# Patient Record
Sex: Female | Born: 1935 | Race: White | Hispanic: No | Smoking: Former smoker
Health system: Southern US, Community
[De-identification: ages and names within clinical notes are randomized; demographics above are authoritative.]

## PROBLEM LIST (undated history)

## (undated) DIAGNOSIS — I739 Peripheral vascular disease, unspecified: Secondary | ICD-10-CM

## (undated) DIAGNOSIS — F325 Major depressive disorder, single episode, in full remission: Secondary | ICD-10-CM

## (undated) DIAGNOSIS — N189 Chronic kidney disease, unspecified: Secondary | ICD-10-CM

## (undated) DIAGNOSIS — I10 Essential (primary) hypertension: Secondary | ICD-10-CM

## (undated) DIAGNOSIS — G309 Alzheimer's disease, unspecified: Secondary | ICD-10-CM

## (undated) DIAGNOSIS — I5032 Chronic diastolic (congestive) heart failure: Secondary | ICD-10-CM

## (undated) DIAGNOSIS — I509 Heart failure, unspecified: Secondary | ICD-10-CM

## (undated) DIAGNOSIS — J449 Chronic obstructive pulmonary disease, unspecified: Secondary | ICD-10-CM

## (undated) DIAGNOSIS — E119 Type 2 diabetes mellitus without complications: Secondary | ICD-10-CM

## (undated) DIAGNOSIS — D6851 Activated protein C resistance: Secondary | ICD-10-CM

## (undated) DIAGNOSIS — J42 Unspecified chronic bronchitis: Secondary | ICD-10-CM

## (undated) DIAGNOSIS — F0280 Dementia in other diseases classified elsewhere without behavioral disturbance: Secondary | ICD-10-CM

## (undated) DIAGNOSIS — K449 Diaphragmatic hernia without obstruction or gangrene: Secondary | ICD-10-CM

## (undated) HISTORY — DX: Activated protein C resistance: D68.51

## (undated) HISTORY — PX: TONSILLECTOMY: SUR1361

## (undated) HISTORY — DX: Unspecified chronic bronchitis: J42

## (undated) HISTORY — PX: VASCULAR SURGERY: SHX849

## (undated) HISTORY — DX: Peripheral vascular disease, unspecified: I73.9

## (undated) HISTORY — DX: Chronic diastolic (congestive) heart failure: I50.32

## (undated) HISTORY — DX: Type 2 diabetes mellitus without complications: E11.9

## (undated) HISTORY — DX: Major depressive disorder, single episode, in full remission: F32.5

## (undated) HISTORY — PX: BREAST EXCISIONAL BIOPSY: SUR124

---

## 1987-08-22 DIAGNOSIS — K449 Diaphragmatic hernia without obstruction or gangrene: Secondary | ICD-10-CM

## 1987-08-22 HISTORY — DX: Diaphragmatic hernia without obstruction or gangrene: K44.9

## 2015-12-27 DIAGNOSIS — J42 Unspecified chronic bronchitis: Secondary | ICD-10-CM

## 2015-12-27 DIAGNOSIS — D6851 Activated protein C resistance: Secondary | ICD-10-CM

## 2015-12-27 DIAGNOSIS — E119 Type 2 diabetes mellitus without complications: Secondary | ICD-10-CM

## 2015-12-27 DIAGNOSIS — F325 Major depressive disorder, single episode, in full remission: Secondary | ICD-10-CM

## 2015-12-27 DIAGNOSIS — I779 Disorder of arteries and arterioles, unspecified: Secondary | ICD-10-CM

## 2015-12-27 DIAGNOSIS — I5032 Chronic diastolic (congestive) heart failure: Secondary | ICD-10-CM

## 2015-12-27 DIAGNOSIS — G309 Alzheimer's disease, unspecified: Secondary | ICD-10-CM

## 2015-12-27 DIAGNOSIS — I739 Peripheral vascular disease, unspecified: Secondary | ICD-10-CM

## 2015-12-27 DIAGNOSIS — F0280 Dementia in other diseases classified elsewhere without behavioral disturbance: Secondary | ICD-10-CM

## 2015-12-27 HISTORY — DX: Peripheral vascular disease, unspecified: I73.9

## 2015-12-27 HISTORY — DX: Unspecified chronic bronchitis: J42

## 2015-12-27 HISTORY — DX: Major depressive disorder, single episode, in full remission: F32.5

## 2015-12-27 HISTORY — DX: Type 2 diabetes mellitus without complications: E11.9

## 2015-12-27 HISTORY — DX: Disorder of arteries and arterioles, unspecified: I77.9

## 2015-12-27 HISTORY — DX: Dementia in other diseases classified elsewhere, unspecified severity, without behavioral disturbance, psychotic disturbance, mood disturbance, and anxiety: F02.80

## 2015-12-27 HISTORY — DX: Chronic diastolic (congestive) heart failure: I50.32

## 2015-12-27 HISTORY — DX: Alzheimer's disease, unspecified: G30.9

## 2015-12-27 HISTORY — DX: Activated protein C resistance: D68.51

## 2017-02-27 ENCOUNTER — Other Ambulatory Visit: Payer: Self-pay | Admitting: Physician Assistant

## 2017-02-27 DIAGNOSIS — R1310 Dysphagia, unspecified: Secondary | ICD-10-CM

## 2017-03-13 ENCOUNTER — Ambulatory Visit: Payer: Medicare Other | Attending: Physician Assistant

## 2017-08-15 ENCOUNTER — Other Ambulatory Visit: Payer: Self-pay

## 2017-08-15 ENCOUNTER — Encounter: Payer: Self-pay | Admitting: Internal Medicine

## 2017-08-15 ENCOUNTER — Emergency Department: Payer: Medicare Other

## 2017-08-15 ENCOUNTER — Inpatient Hospital Stay
Admission: EM | Admit: 2017-08-15 | Discharge: 2017-08-21 | DRG: 280 | Disposition: A | Discharge status: Skilled Nursing Facility | Payer: Medicare Other | Attending: Internal Medicine | Admitting: Internal Medicine

## 2017-08-15 ENCOUNTER — Inpatient Hospital Stay
Admit: 2017-08-15 | Discharge: 2017-08-15 | Disposition: A | Discharge status: Home / Self Care | Payer: Medicare Other | Attending: Internal Medicine | Admitting: Internal Medicine

## 2017-08-15 DIAGNOSIS — D6851 Activated protein C resistance: Secondary | ICD-10-CM | POA: Diagnosis present

## 2017-08-15 DIAGNOSIS — E875 Hyperkalemia: Secondary | ICD-10-CM | POA: Diagnosis present

## 2017-08-15 DIAGNOSIS — G9340 Encephalopathy, unspecified: Secondary | ICD-10-CM | POA: Diagnosis present

## 2017-08-15 DIAGNOSIS — E1122 Type 2 diabetes mellitus with diabetic chronic kidney disease: Secondary | ICD-10-CM | POA: Diagnosis present

## 2017-08-15 DIAGNOSIS — I6522 Occlusion and stenosis of left carotid artery: Secondary | ICD-10-CM | POA: Diagnosis present

## 2017-08-15 DIAGNOSIS — J189 Pneumonia, unspecified organism: Secondary | ICD-10-CM | POA: Diagnosis present

## 2017-08-15 DIAGNOSIS — Z79899 Other long term (current) drug therapy: Secondary | ICD-10-CM

## 2017-08-15 DIAGNOSIS — J440 Chronic obstructive pulmonary disease with acute lower respiratory infection: Secondary | ICD-10-CM | POA: Diagnosis present

## 2017-08-15 DIAGNOSIS — Z515 Encounter for palliative care: Secondary | ICD-10-CM | POA: Diagnosis not present

## 2017-08-15 DIAGNOSIS — J9621 Acute and chronic respiratory failure with hypoxia: Secondary | ICD-10-CM | POA: Diagnosis present

## 2017-08-15 DIAGNOSIS — N179 Acute kidney failure, unspecified: Secondary | ICD-10-CM | POA: Diagnosis present

## 2017-08-15 DIAGNOSIS — R945 Abnormal results of liver function studies: Secondary | ICD-10-CM | POA: Diagnosis present

## 2017-08-15 DIAGNOSIS — Z66 Do not resuscitate: Secondary | ICD-10-CM | POA: Diagnosis present

## 2017-08-15 DIAGNOSIS — Y95 Nosocomial condition: Secondary | ICD-10-CM | POA: Diagnosis present

## 2017-08-15 DIAGNOSIS — J9690 Respiratory failure, unspecified, unspecified whether with hypoxia or hypercapnia: Secondary | ICD-10-CM

## 2017-08-15 DIAGNOSIS — J9601 Acute respiratory failure with hypoxia: Secondary | ICD-10-CM | POA: Diagnosis not present

## 2017-08-15 DIAGNOSIS — E876 Hypokalemia: Secondary | ICD-10-CM | POA: Diagnosis present

## 2017-08-15 DIAGNOSIS — I248 Other forms of acute ischemic heart disease: Secondary | ICD-10-CM | POA: Diagnosis present

## 2017-08-15 DIAGNOSIS — J181 Lobar pneumonia, unspecified organism: Secondary | ICD-10-CM

## 2017-08-15 DIAGNOSIS — R7881 Bacteremia: Secondary | ICD-10-CM | POA: Diagnosis present

## 2017-08-15 DIAGNOSIS — Z825 Family history of asthma and other chronic lower respiratory diseases: Secondary | ICD-10-CM

## 2017-08-15 DIAGNOSIS — Z882 Allergy status to sulfonamides status: Secondary | ICD-10-CM

## 2017-08-15 DIAGNOSIS — E872 Acidosis: Secondary | ICD-10-CM | POA: Diagnosis present

## 2017-08-15 DIAGNOSIS — J810 Acute pulmonary edema: Secondary | ICD-10-CM | POA: Diagnosis not present

## 2017-08-15 DIAGNOSIS — G308 Other Alzheimer's disease: Secondary | ICD-10-CM | POA: Diagnosis not present

## 2017-08-15 DIAGNOSIS — N184 Chronic kidney disease, stage 4 (severe): Secondary | ICD-10-CM | POA: Diagnosis present

## 2017-08-15 DIAGNOSIS — G309 Alzheimer's disease, unspecified: Secondary | ICD-10-CM | POA: Diagnosis present

## 2017-08-15 DIAGNOSIS — F329 Major depressive disorder, single episode, unspecified: Secondary | ICD-10-CM | POA: Diagnosis present

## 2017-08-15 DIAGNOSIS — R0902 Hypoxemia: Secondary | ICD-10-CM

## 2017-08-15 DIAGNOSIS — I5033 Acute on chronic diastolic (congestive) heart failure: Secondary | ICD-10-CM | POA: Diagnosis present

## 2017-08-15 DIAGNOSIS — Z794 Long term (current) use of insulin: Secondary | ICD-10-CM

## 2017-08-15 DIAGNOSIS — Z9981 Dependence on supplemental oxygen: Secondary | ICD-10-CM | POA: Diagnosis not present

## 2017-08-15 DIAGNOSIS — R0602 Shortness of breath: Secondary | ICD-10-CM

## 2017-08-15 DIAGNOSIS — Z87891 Personal history of nicotine dependence: Secondary | ICD-10-CM

## 2017-08-15 DIAGNOSIS — N390 Urinary tract infection, site not specified: Secondary | ICD-10-CM | POA: Diagnosis present

## 2017-08-15 DIAGNOSIS — I130 Hypertensive heart and chronic kidney disease with heart failure and stage 1 through stage 4 chronic kidney disease, or unspecified chronic kidney disease: Principal | ICD-10-CM | POA: Diagnosis present

## 2017-08-15 DIAGNOSIS — R748 Abnormal levels of other serum enzymes: Secondary | ICD-10-CM | POA: Diagnosis not present

## 2017-08-15 DIAGNOSIS — I6529 Occlusion and stenosis of unspecified carotid artery: Secondary | ICD-10-CM | POA: Diagnosis present

## 2017-08-15 DIAGNOSIS — I4510 Unspecified right bundle-branch block: Secondary | ICD-10-CM | POA: Diagnosis present

## 2017-08-15 DIAGNOSIS — R0603 Acute respiratory distress: Secondary | ICD-10-CM

## 2017-08-15 DIAGNOSIS — B957 Other staphylococcus as the cause of diseases classified elsewhere: Secondary | ICD-10-CM | POA: Diagnosis present

## 2017-08-15 DIAGNOSIS — F0280 Dementia in other diseases classified elsewhere without behavioral disturbance: Secondary | ICD-10-CM

## 2017-08-15 DIAGNOSIS — E1151 Type 2 diabetes mellitus with diabetic peripheral angiopathy without gangrene: Secondary | ICD-10-CM | POA: Diagnosis present

## 2017-08-15 DIAGNOSIS — I214 Non-ST elevation (NSTEMI) myocardial infarction: Secondary | ICD-10-CM | POA: Diagnosis present

## 2017-08-15 DIAGNOSIS — R778 Other specified abnormalities of plasma proteins: Secondary | ICD-10-CM

## 2017-08-15 DIAGNOSIS — Z7902 Long term (current) use of antithrombotics/antiplatelets: Secondary | ICD-10-CM

## 2017-08-15 DIAGNOSIS — E1165 Type 2 diabetes mellitus with hyperglycemia: Secondary | ICD-10-CM | POA: Diagnosis present

## 2017-08-15 DIAGNOSIS — R7989 Other specified abnormal findings of blood chemistry: Secondary | ICD-10-CM

## 2017-08-15 HISTORY — DX: Essential (primary) hypertension: I10

## 2017-08-15 HISTORY — DX: Chronic obstructive pulmonary disease, unspecified: J44.9

## 2017-08-15 HISTORY — DX: Heart failure, unspecified: I50.9

## 2017-08-15 HISTORY — DX: Dementia in other diseases classified elsewhere without behavioral disturbance: F02.80

## 2017-08-15 HISTORY — DX: Type 2 diabetes mellitus without complications: E11.9

## 2017-08-15 HISTORY — DX: Alzheimer's disease, unspecified: G30.9

## 2017-08-15 HISTORY — DX: Peripheral vascular disease, unspecified: I73.9

## 2017-08-15 HISTORY — DX: Chronic kidney disease, unspecified: N18.9

## 2017-08-15 LAB — COMPREHENSIVE METABOLIC PANEL
ALBUMIN: 3.2 g/dL — AB (ref 3.5–5.0)
ALT: 102 U/L — ABNORMAL HIGH (ref 14–54)
ANION GAP: 9 (ref 5–15)
AST: 130 U/L — AB (ref 15–41)
Alkaline Phosphatase: 74 U/L (ref 38–126)
BILIRUBIN TOTAL: 0.8 mg/dL (ref 0.3–1.2)
BUN: 53 mg/dL — AB (ref 6–20)
CO2: 21 mmol/L — AB (ref 22–32)
Calcium: 8.9 mg/dL (ref 8.9–10.3)
Chloride: 107 mmol/L (ref 101–111)
Creatinine, Ser: 2.48 mg/dL — ABNORMAL HIGH (ref 0.44–1.00)
GFR calc Af Amer: 19 mL/min — ABNORMAL LOW (ref >60)
GFR calc non Af Amer: 17 mL/min — ABNORMAL LOW (ref >60)
GLUCOSE: 234 mg/dL — AB (ref 65–99)
POTASSIUM: 5.6 mmol/L — AB (ref 3.5–5.1)
SODIUM: 137 mmol/L (ref 135–145)
Total Protein: 8.3 g/dL — ABNORMAL HIGH (ref 6.5–8.1)

## 2017-08-15 LAB — BRAIN NATRIURETIC PEPTIDE: B Natriuretic Peptide: 2150 pg/mL — ABNORMAL HIGH (ref 0.0–100.0)

## 2017-08-15 LAB — URINALYSIS, ROUTINE W REFLEX MICROSCOPIC
Bilirubin Urine: NEGATIVE
GLUCOSE, UA: 150 mg/dL — AB
KETONES UR: NEGATIVE mg/dL
NITRITE: NEGATIVE
PH: 5 (ref 5.0–8.0)
Protein, ur: 100 mg/dL — AB
Specific Gravity, Urine: 1.012 (ref 1.005–1.030)

## 2017-08-15 LAB — PROCALCITONIN: PROCALCITONIN: 0.56 ng/mL

## 2017-08-15 LAB — CBC WITH DIFFERENTIAL/PLATELET
BASOS ABS: 0 10*3/uL (ref 0–0.1)
Basophils Relative: 0 %
EOS PCT: 0 %
Eosinophils Absolute: 0 10*3/uL (ref 0–0.7)
HEMATOCRIT: 39.5 % (ref 35.0–47.0)
Hemoglobin: 12.5 g/dL (ref 12.0–16.0)
LYMPHS ABS: 0.3 10*3/uL — AB (ref 1.0–3.6)
LYMPHS PCT: 2 %
MCH: 30.1 pg (ref 26.0–34.0)
MCHC: 31.6 g/dL — AB (ref 32.0–36.0)
MCV: 95.1 fL (ref 80.0–100.0)
MONO ABS: 0.6 10*3/uL (ref 0.2–0.9)
MONOS PCT: 5 %
NEUTROS ABS: 11.4 10*3/uL — AB (ref 1.4–6.5)
Neutrophils Relative %: 93 %
PLATELETS: 237 10*3/uL (ref 150–440)
RBC: 4.15 MIL/uL (ref 3.80–5.20)
RDW: 13.9 % (ref 11.5–14.5)
WBC: 12.4 10*3/uL — ABNORMAL HIGH (ref 3.6–11.0)

## 2017-08-15 LAB — BLOOD GAS, ARTERIAL
ACID-BASE DEFICIT: 6.4 mmol/L — AB (ref 0.0–2.0)
BICARBONATE: 20.6 mmol/L (ref 20.0–28.0)
Delivery systems: POSITIVE
Expiratory PAP: 5
FIO2: 0.4
Inspiratory PAP: 10
MECHANICAL RATE: 12
O2 Saturation: 98 %
PATIENT TEMPERATURE: 37
PCO2 ART: 46 mmHg (ref 32.0–48.0)
PH ART: 7.26 — AB (ref 7.350–7.450)
pO2, Arterial: 118 mmHg — ABNORMAL HIGH (ref 83.0–108.0)

## 2017-08-15 LAB — GLUCOSE, CAPILLARY
GLUCOSE-CAPILLARY: 197 mg/dL — AB (ref 65–99)
GLUCOSE-CAPILLARY: 190 mg/dL — AB (ref 65–99)
GLUCOSE-CAPILLARY: 138 mg/dL — AB (ref 65–99)
Glucose-Capillary: 138 mg/dL — ABNORMAL HIGH (ref 65–99)
Glucose-Capillary: 185 mg/dL — ABNORMAL HIGH (ref 65–99)

## 2017-08-15 LAB — CREATININE, SERUM
CREATININE: 2.52 mg/dL — AB (ref 0.44–1.00)
GFR calc Af Amer: 19 mL/min — ABNORMAL LOW (ref >60)
GFR, EST NON AFRICAN AMERICAN: 16 mL/min — AB (ref >60)

## 2017-08-15 LAB — ECHOCARDIOGRAM COMPLETE
Height: 67 in
Weight: 2493.84 oz

## 2017-08-15 LAB — LACTIC ACID, PLASMA: Lactic Acid, Venous: 2 mmol/L (ref 0.5–1.9)

## 2017-08-15 LAB — MRSA PCR SCREENING: MRSA BY PCR: NEGATIVE

## 2017-08-15 LAB — TROPONIN I
TROPONIN I: 1.39 ng/mL — AB (ref <0.03)
Troponin I: 6.25 ng/mL (ref <0.03)
Troponin I: 11.17 ng/mL (ref <0.03)

## 2017-08-15 LAB — POTASSIUM: POTASSIUM: 3.9 mmol/L (ref 3.5–5.1)

## 2017-08-15 MED ORDER — CLOPIDOGREL BISULFATE 75 MG PO TABS
75.0000 mg | ORAL_TABLET | Freq: Every day | ORAL | Status: DC
  Administered 2017-08-15 – 2017-08-21 (×7): 75 mg via ORAL
  Filled 2017-08-15 (×8): qty 1

## 2017-08-15 MED ORDER — AMLODIPINE BESYLATE 5 MG PO TABS: 2.5000 mg | ORAL_TABLET | Freq: Every day | ORAL | Status: DC

## 2017-08-15 MED ORDER — INSULIN ASPART 100 UNIT/ML ~~LOC~~ SOLN: 0.0000 [IU] | Freq: Every day | SUBCUTANEOUS | Status: DC

## 2017-08-15 MED ORDER — ONDANSETRON HCL 4 MG/2ML IJ SOLN: 4.0000 mg | Freq: Four times a day (QID) | INTRAMUSCULAR | Status: DC | PRN

## 2017-08-15 MED ORDER — PIPERACILLIN-TAZOBACTAM 3.375 G IVPB 30 MIN
3.3750 g | Freq: Once | INTRAVENOUS | Status: AC
  Administered 2017-08-15: 3.375 g via INTRAVENOUS
  Filled 2017-08-15: qty 50

## 2017-08-15 MED ORDER — FAMOTIDINE 20 MG PO TABS
10.0000 mg | ORAL_TABLET | Freq: Every day | ORAL | Status: DC
  Administered 2017-08-15 – 2017-08-21 (×7): 10 mg via ORAL
  Filled 2017-08-15 (×7): qty 1

## 2017-08-15 MED ORDER — SODIUM POLYSTYRENE SULFONATE 15 GM/60ML PO SUSP
30.0000 g | Freq: Once | ORAL | Status: AC
  Administered 2017-08-15: 30 g via ORAL
  Filled 2017-08-15: qty 120

## 2017-08-15 MED ORDER — AZITHROMYCIN 500 MG IV SOLR
250.0000 mg | INTRAVENOUS | Status: DC
  Administered 2017-08-15 – 2017-08-16 (×2): 250 mg via INTRAVENOUS
  Filled 2017-08-15 (×3): qty 250

## 2017-08-15 MED ORDER — BISACODYL 5 MG PO TBEC: 5.0000 mg | DELAYED_RELEASE_TABLET | Freq: Every day | ORAL | Status: DC | PRN

## 2017-08-15 MED ORDER — SODIUM CHLORIDE 0.9% FLUSH: 3.0000 mL | INTRAVENOUS | Status: DC | PRN

## 2017-08-15 MED ORDER — ASPIRIN EC 81 MG PO TBEC
81.0000 mg | DELAYED_RELEASE_TABLET | Freq: Every day | ORAL | Status: DC
  Administered 2017-08-16 – 2017-08-21 (×6): 81 mg via ORAL
  Filled 2017-08-15 (×6): qty 1

## 2017-08-15 MED ORDER — ALPRAZOLAM 0.25 MG PO TABS
0.1250 mg | ORAL_TABLET | Freq: Every day | ORAL | Status: DC
  Administered 2017-08-17 – 2017-08-19 (×3): 0.25 mg via ORAL
  Filled 2017-08-15 (×3): qty 1

## 2017-08-15 MED ORDER — ATORVASTATIN CALCIUM 20 MG PO TABS
80.0000 mg | ORAL_TABLET | Freq: Every day | ORAL | Status: DC
  Administered 2017-08-15 – 2017-08-20 (×6): 80 mg via ORAL
  Filled 2017-08-15 (×6): qty 4

## 2017-08-15 MED ORDER — TIOTROPIUM BROMIDE MONOHYDRATE 18 MCG IN CAPS
18.0000 ug | ORAL_CAPSULE | Freq: Every day | RESPIRATORY_TRACT | Status: DC
  Filled 2017-08-15: qty 5

## 2017-08-15 MED ORDER — FUROSEMIDE 10 MG/ML IJ SOLN
40.0000 mg | Freq: Two times a day (BID) | INTRAMUSCULAR | Status: AC
  Administered 2017-08-15 – 2017-08-16 (×3): 40 mg via INTRAVENOUS
  Filled 2017-08-15 (×3): qty 4

## 2017-08-15 MED ORDER — FUROSEMIDE 10 MG/ML IJ SOLN
40.0000 mg | Freq: Once | INTRAMUSCULAR | Status: AC
  Administered 2017-08-15: 40 mg via INTRAVENOUS
  Filled 2017-08-15: qty 4

## 2017-08-15 MED ORDER — ACETAMINOPHEN 650 MG RE SUPP: 650.0000 mg | Freq: Four times a day (QID) | RECTAL | Status: DC | PRN

## 2017-08-15 MED ORDER — CARVEDILOL 3.125 MG PO TABS: 3.1250 mg | ORAL_TABLET | Freq: Two times a day (BID) | ORAL | Status: DC

## 2017-08-15 MED ORDER — SODIUM CHLORIDE 0.9 % IV BOLUS (SEPSIS)
1000.0000 mL | Freq: Once | INTRAVENOUS | Status: AC
  Administered 2017-08-15: 1000 mL via INTRAVENOUS

## 2017-08-15 MED ORDER — ONDANSETRON HCL 4 MG PO TABS: 4.0000 mg | ORAL_TABLET | Freq: Four times a day (QID) | ORAL | Status: DC | PRN

## 2017-08-15 MED ORDER — GUAIFENESIN-DM 100-10 MG/5ML PO SYRP
5.0000 mL | ORAL_SOLUTION | ORAL | Status: DC | PRN
  Administered 2017-08-20: 5 mL via ORAL
  Filled 2017-08-15: qty 5

## 2017-08-15 MED ORDER — SODIUM CHLORIDE 0.9 % IV SOLN
250.0000 mL | INTRAVENOUS | Status: DC | PRN
Start: 2017-08-15 — End: 2017-08-21

## 2017-08-15 MED ORDER — ALBUTEROL SULFATE (2.5 MG/3ML) 0.083% IN NEBU: 2.5000 mg | INHALATION_SOLUTION | RESPIRATORY_TRACT | Status: DC | PRN

## 2017-08-15 MED ORDER — INSULIN ASPART 100 UNIT/ML ~~LOC~~ SOLN
0.0000 [IU] | SUBCUTANEOUS | Status: DC
  Administered 2017-08-15 (×3): 2 [IU] via SUBCUTANEOUS
  Administered 2017-08-15: 1 [IU] via SUBCUTANEOUS
  Filled 2017-08-15 (×4): qty 1

## 2017-08-15 MED ORDER — GUAIFENESIN ER 600 MG PO TB12: 600.0000 mg | ORAL_TABLET | Freq: Two times a day (BID) | ORAL | Status: DC

## 2017-08-15 MED ORDER — ASPIRIN 81 MG PO CHEW
324.0000 mg | CHEWABLE_TABLET | Freq: Once | ORAL | Status: AC
  Administered 2017-08-15: 324 mg via ORAL
  Filled 2017-08-15: qty 4

## 2017-08-15 MED ORDER — ACETAMINOPHEN 325 MG PO TABS
650.0000 mg | ORAL_TABLET | Freq: Four times a day (QID) | ORAL | Status: DC | PRN
Start: 2017-08-15 — End: 2017-08-21

## 2017-08-15 MED ORDER — HEPARIN SODIUM (PORCINE) 5000 UNIT/ML IJ SOLN
5000.0000 [IU] | Freq: Three times a day (TID) | INTRAMUSCULAR | Status: DC
  Administered 2017-08-15 – 2017-08-21 (×16): 5000 [IU] via SUBCUTANEOUS
  Filled 2017-08-15 (×16): qty 1

## 2017-08-15 MED ORDER — COLCHICINE 0.6 MG PO TABS: 0.6000 mg | ORAL_TABLET | Freq: Two times a day (BID) | ORAL | Status: DC

## 2017-08-15 MED ORDER — ORAL CARE MOUTH RINSE
15.0000 mL | Freq: Two times a day (BID) | OROMUCOSAL | Status: DC
  Administered 2017-08-15 – 2017-08-20 (×6): 15 mL via OROMUCOSAL

## 2017-08-15 MED ORDER — DEXTROSE 5 % IV SOLN
1.0000 g | INTRAVENOUS | Status: DC
  Administered 2017-08-15 – 2017-08-17 (×3): 1 g via INTRAVENOUS
  Filled 2017-08-15 (×4): qty 10

## 2017-08-15 MED ORDER — SODIUM CHLORIDE 0.9% FLUSH
3.0000 mL | Freq: Two times a day (BID) | INTRAVENOUS | Status: DC
  Administered 2017-08-15 – 2017-08-21 (×13): 3 mL via INTRAVENOUS

## 2017-08-15 MED ORDER — IPRATROPIUM-ALBUTEROL 0.5-2.5 (3) MG/3ML IN SOLN
3.0000 mL | Freq: Four times a day (QID) | RESPIRATORY_TRACT | Status: DC
  Administered 2017-08-15 – 2017-08-17 (×10): 3 mL via RESPIRATORY_TRACT
  Filled 2017-08-15 (×10): qty 3

## 2017-08-15 MED ORDER — CHLORHEXIDINE GLUCONATE 0.12 % MT SOLN
15.0000 mL | Freq: Two times a day (BID) | OROMUCOSAL | Status: DC
  Administered 2017-08-16 – 2017-08-21 (×9): 15 mL via OROMUCOSAL
  Filled 2017-08-15 (×8): qty 15

## 2017-08-15 MED ORDER — VANCOMYCIN HCL IN DEXTROSE 1-5 GM/200ML-% IV SOLN
1000.0000 mg | Freq: Once | INTRAVENOUS | Status: AC
  Administered 2017-08-15: 1000 mg via INTRAVENOUS
  Filled 2017-08-15: qty 200

## 2017-08-15 MED ORDER — SENNOSIDES-DOCUSATE SODIUM 8.6-50 MG PO TABS: 1.0000 | ORAL_TABLET | Freq: Every evening | ORAL | Status: DC | PRN

## 2017-08-15 MED ORDER — INSULIN ASPART 100 UNIT/ML ~~LOC~~ SOLN: 0.0000 [IU] | Freq: Three times a day (TID) | SUBCUTANEOUS | Status: DC

## 2017-08-15 MED ORDER — MEMANTINE HCL 10 MG PO TABS
10.0000 mg | ORAL_TABLET | Freq: Two times a day (BID) | ORAL | Status: DC
  Administered 2017-08-16 – 2017-08-21 (×11): 10 mg via ORAL
  Filled 2017-08-15 (×14): qty 1

## 2017-08-15 NOTE — Progress Notes (Signed)
Spoke with MD Fath and notified him of elevation in troponin values from admission to present time.

## 2017-08-15 NOTE — Progress Notes (Signed)
CODE SEPSIS - PHARMACY COMMUNICATION  **Broad Spectrum Antibiotics should be administered within 1 hour of Sepsis diagnosis**  Time Code Sepsis Called/Page Received: 12/26 0645  Antibiotics Ordered: 12/26 0645  Zosyn and Vanc  Time of 1st antibiotic administration: 0745 Vanc,  0748 Zosyn  Additional action taken by pharmacy: Called ER RN @ [phone]- per RN they are working on getting IV access on pt.  Called RN again @ [phone] as Clement HusbandsVanc was charted as hung but not Zosyn yet. RN said he was in process of hanging Zosyn  If necessary, Name of Provider/Nurse Contacted: Melinda CrutchBill    Klea Nall A ,PharmD Clinical Pharmacist  08/15/2017  7:58 AM

## 2017-08-15 NOTE — Progress Notes (Signed)
Per floor RN pt has 2 IV access, waiting for nephrologist to see patient. Will update PICC team after seen by renal per primary nurse.

## 2017-08-15 NOTE — ED Provider Notes (Signed)
[facility] Emergency Department Provider Note   ____________________________________________   First MD Initiated Contact with Patient 08/15/17 [phone]     (approximate)  I have reviewed the triage vital signs and the nursing notes.   HISTORY  Chief Complaint Shortness of Breath  Level 5 caveat: Limited by distress  HPI Cassandra Mccall is a 83 y.o. female brought to the ED from home via EMS with a chief complaint of shortness of breath.  EMS reports patient is on day 3 of Z-Pak for pneumonia.  Family states patient slid down in the bed and the family had a very hard time getting her back up secondary to generalized weakness.  Patient denies chest pain but complains of chills, cough and difficulty breathing.  Arrives to the ED in moderate respiratory distress with room air saturations 85%.   Past medical history . Alzheimer disease 12/27/2015  . CHF (congestive heart failure), NYHA class I, chronic, diastolic (CMS-HCC) 12/27/2015  . Chronic bronchitis (CMS-HCC) 12/27/2015  . Controlled type 2 diabetes mellitus without complication (CMS-HCC) 12/27/2015  . Depression, major, in remission (CMS-HCC) 12/27/2015  On xanax prn  . Factor V Leiden (CMS-HCC) 12/27/2015  . Left-sided carotid artery disease (CMS-HCC) 12/27/2015  100 % left sided, less so right  . PVD (peripheral vascular disease) (CMS-HCC) 12/27/2015  Post stents in [place]   There are no active problems to display for this patient.   Past surgical history . BREAST EXCISIONAL BIOPSY  . TONSILLECTOMY    Prior to Admission medications   Not on File    Allergies . Statins-Hmg-Coa Reductase Inhibitors Muscle Pain  Unclear if took  . Sulfa (Sulfonamide Antibiotics) Unknown   Family history . Heart failure Mother  . COPD Mother  . Alzheimer's disease Father  . COPD Sister   Social History Social History   Tobacco Use  . Smoking status: Not on file  Substance Use Topics  . Alcohol use: Not on  file  . Drug use: Not on file  Non-smoker  Review of Systems  Constitutional: Positive for chills. Eyes: No visual changes. ENT: No sore throat. Cardiovascular: Denies chest pain. Respiratory: Positive for cough and shortness of breath. Gastrointestinal: No abdominal pain.  No nausea, no vomiting.  No diarrhea.  No constipation. Genitourinary: Negative for dysuria. Musculoskeletal: Negative for back pain. Skin: Negative for rash. Neurological: Negative for headaches, focal weakness or numbness.   ____________________________________________   PHYSICAL EXAM:  VITAL SIGNS: ED Triage Vitals  Enc Vitals Group     BP      Pulse      Resp      Temp      Temp src      SpO2      Weight      Height      Head Circumference      Peak Flow      Pain Score      Pain Loc      Pain Edu?      Excl. in GC?     Constitutional: Alert and oriented. Ill appearing and in moderate acute distress. Eyes: Conjunctivae are normal. PERRL. EOMI. Head: Atraumatic. Nose: No congestion/rhinnorhea. Mouth/Throat: Mucous membranes are moist.  Oropharynx non-erythematous. Neck: No stridor.   Cardiovascular: Normal rate, regular rhythm. Grossly normal heart sounds.  Good peripheral circulation. Respiratory: Increased respiratory effort.  No retractions. Lungs with rhonchi and rales bilaterally. Gastrointestinal: Soft and nontender. No distention. No abdominal bruits. No CVA tenderness. Musculoskeletal: No lower  extremity tenderness nor edema.  No joint effusions. Neurologic:  Normal speech and language. No gross focal neurologic deficits are appreciated.  Skin:  Skin is warm, dry and intact. No rash noted. Psychiatric: Mood and affect are normal. Speech and behavior are normal.  ____________________________________________   LABS (all labs ordered are listed, but only abnormal results are displayed)  Labs Reviewed  CULTURE, BLOOD (ROUTINE X 2)  CULTURE, BLOOD (ROUTINE X 2)  URINE CULTURE    COMPREHENSIVE METABOLIC PANEL  CBC WITH DIFFERENTIAL/PLATELET  URINALYSIS, ROUTINE W REFLEX MICROSCOPIC  LACTIC ACID, PLASMA  LACTIC ACID, PLASMA  BRAIN NATRIURETIC PEPTIDE  TROPONIN I  BLOOD GAS, ARTERIAL   ____________________________________________  EKG  ED ECG REPORT I, SUNG,JADE J, the attending physician, personally viewed and interpreted this ECG.   Date: 08/15/2017  EKG Time: 0648  Rate: 86  Rhythm: normal EKG, normal sinus rhythm  Axis: Normal  Intervals:right bundle branch block  ST&T Change: Nonspecific  ____________________________________________  RADIOLOGY  No results found.  ____________________________________________   PROCEDURES  Procedure(s) performed: None  Procedures  Critical Care performed: Yes, see critical care note(s)   CRITICAL CARE Performed by: Irean Hong   Total critical care time: 30 minutes  Critical care time was exclusive of separately billable procedures and treating other patients.  Critical care was necessary to treat or prevent imminent or life-threatening deterioration.  Critical care was time spent personally by me on the following activities: development of treatment plan with patient and/or surrogate as well as nursing, discussions with consultants, evaluation of patient's response to treatment, examination of patient, obtaining history from patient or surrogate, ordering and performing treatments and interventions, ordering and review of laboratory studies, ordering and review of radiographic studies, pulse oximetry and re-evaluation of patient's condition.  ____________________________________________   INITIAL IMPRESSION / ASSESSMENT AND PLAN / ED COURSE  As part of my medical decision making, I reviewed the following data within the electronic MEDICAL RECORD NUMBER History obtained from family, Nursing notes reviewed and incorporated, Labs reviewed, EKG interpreted, Old chart reviewed, Patient signed out to Dr.  Mayford Knife, Radiograph reviewed, Discussed with admitting physician and Notes from prior ED visits.   83 year old female who presents with respiratory distress. Differential includes, but is not limited to, viral syndrome, bronchitis including COPD exacerbation, pneumonia, reactive airway disease including asthma, CHF including exacerbation with or without pulmonary/interstitial edema, pneumothorax, ACS, thoracic trauma, and pulmonary embolism.  Patient with a history of CAD, chronic bronchitis, CHF, factor V Leiden currently on azithromycin for community-acquired pneumonia.  She arrives in moderate respiratory distress, room air saturations 85%, tachypneic with rales and rhonchi on auscultation.  ED code sepsis called upon patient's arrival to the treatment room.  Will place patient on BiPAP, check screening lab work, chest x-ray.  Anticipate hospitalization.  Care transferred to oncoming provider Dr. Mayford Knife.  Clinical Course as of Aug 16 703  Wed Aug 15, 2017  0703 Patient looking like she is improving on BiPAP.  Family at bedside.  Will initiate first liter IV fluids.  Hold additional fluids pending lactate as patient has history of CHF.  [JS]    Clinical Course User Index [JS] Irean Hong, MD     ____________________________________________   FINAL CLINICAL IMPRESSION(S) / ED DIAGNOSES  Final diagnoses:  Hypoxia  SOB (shortness of breath)  HCAP (healthcare-associated pneumonia)  Respiratory distress     ED Discharge Orders    None       Note:  This document was prepared using Dragon  voice recognition software and may include unintentional dictation errors.    Irean HongSung, Jade J, MD 08/15/17 [phone]

## 2017-08-15 NOTE — ED Triage Notes (Signed)
[facility] reports pt is on day 3 of ABX t/x for pneumonia. EMS states pt "slid down in the bed and family had a very hard time getting her up"; EMS reports that pt's O2 sats drop when trying to cough, but that her O2 sats recover after the pt is able to clear her throat. Pt arrives on supplemental O2 via [place], but only wears O2 at home at night. Dr Dolores FrameSung at bedside upon pt's arrival to ED.

## 2017-08-15 NOTE — Progress Notes (Addendum)
CODE SEPSIS - PHARMACY COMMUNICATION  **Broad Spectrum Antibiotics should be administered within 1 hour of Sepsis diagnosis**  Time Code Sepsis Called/Page Received: 12/26 0645  Antibiotics Ordered: 12/26 0645  Zosyn and Vanc  Time of 1st antibiotic administration:   Additional action taken by pharmacy: Called ER RN @ [phone]- per RN they are working on getting IV access on pt.  If necessary, Name of Provider/Nurse Contacted:     Erich MontaneMcBane,Matthew S ,PharmD Clinical Pharmacist  08/15/2017  6:49 AM

## 2017-08-15 NOTE — Consult Note (Signed)
PULMONARY / CRITICAL CARE MEDICINE   Name: Cassandra Mccall MRN: [id number] DOB: [birth year]    ADMISSION DATE:  08/15/2017 CONSULTATION DATE:  12/26  REFERRING MD:  Imogene Burn  PT PROFILE: 37 F with advanced dementia admitted via ED with acute hypoxemic respiratory failure and distress, suspected pneumonia, possible CHF, AKI on BiPAP.  DNR  MAJOR EVENTS/TEST RESULTS:   INDWELLING DEVICES:   MICRO DATA: MRSA PCR 12/26 >>  Urine 12/26>>  Blood 12/26 >>   ANTIMICROBIALS:  Azithromycin 12/26 >>  Ceftriaxone 12/26 >>   HISTORY OF PRESENT ILLNESS:   Cassandra Mccall has advanced dementia but lives at home with her daughter and son.  3 days prior to admission she developed cough and was seen in the walk-in clinic at [facility]  She was told that she might have pneumonia based on a chest x-ray.  She was started on azithromycin.  On the night of admission, she developed drenching sweats and increased cough and respiratory distress.  She was brought to the emergency department by EMS where she was found to be hypoxic.  Noninvasive ventilation was initiated.  PAST MEDICAL HISTORY :  She  has a past medical history of Alzheimer disease, CHF (congestive heart failure) (HCC), Chronic kidney disease, COPD (chronic obstructive pulmonary disease) (HCC), Diabetes mellitus without complication (HCC), Hypertension, PAD (peripheral artery disease) (HCC), and PVD (peripheral vascular disease) (HCC).  PAST SURGICAL HISTORY: She  has a past surgical history that includes Vascular surgery.  Allergies  Allergen Reactions  . Sulfa Antibiotics     No current facility-administered medications on file prior to encounter.    Current Outpatient Medications on File Prior to Encounter  Medication Sig  . albuterol (PROVENTIL HFA) 108 (90 Base) MCG/ACT inhaler Inhale 2 puffs into the lungs every 6 (six) hours as needed.  . ALPRAZolam (XANAX) 0.25 MG tablet Take 0.5-1 tablets by mouth at bedtime.   Marland Kitchen amLODipine  (NORVASC) 2.5 MG tablet Take 1 tablet by mouth daily.  Alphonsus Sias Pollen 580 MG CAPS Take 2 capsules by mouth 2 (two) times daily.   . clopidogrel (PLAVIX) 75 MG tablet Take 1 tablet by mouth daily.  . fluticasone-salmeterol (ADVAIR HFA) 230-21 MCG/ACT inhaler Inhale 2 puffs into the lungs every 12 (twelve) hours as needed.  . furosemide (LASIX) 20 MG tablet Take 1 tablet by mouth daily.  Marland Kitchen guaiFENesin 200 MG tablet Take 600 mg by mouth 2 (two) times daily.   . memantine (NAMENDA) 10 MG tablet Take 1 tablet by mouth 2 (two) times daily.  . metoprolol succinate (TOPROL-XL) 25 MG 24 hr tablet Take 1 tablet by mouth daily.  . Omega-3 Fatty Acids (FISH OIL PO) Take 1 capsule by mouth 2 (two) times daily.  . potassium chloride (K-DUR) 10 MEQ tablet Take 1 tablet by mouth daily as needed.  . ranitidine (ZANTAC) 75 MG tablet Take 150 mg by mouth daily.   Marland Kitchen tiotropium (SPIRIVA) 18 MCG inhalation capsule Place 18 mcg into inhaler and inhale daily.  . vitamin B-12 (CYANOCOBALAMIN) 100 MCG tablet Take 100 mcg by mouth daily.  . mupirocin ointment (BACTROBAN) 2 % Apply 1 application topically 2 (two) times daily.    FAMILY HISTORY:  Her indicated that the status of her mother is unknown. She indicated that the status of her father is unknown.   SOCIAL HISTORY: Former smoker No significant occupational or environmental exposures    REVIEW OF SYSTEMS:   Level 5 caveat  SUBJECTIVE:    VITAL SIGNS: BP Marland Kitchen)  142/61   Pulse 73   Temp 98.5 F (36.9 C) (Axillary)   Resp (!) 26   Ht 5\' 7"  (1.702 m)   Wt 70.7 kg (155 lb 13.8 oz)   SpO2 99%   BMI 24.41 kg/m   HEMODYNAMICS:    VENTILATOR SETTINGS: FiO2 (%):  [30 %] 30 %  INTAKE / OUTPUT: No intake/output data recorded.  PHYSICAL EXAMINATION: General: Alert, pleasant, well supported on BiPAP Neuro: Cranial nerves intact, motor and sensory intact HEENT: NCAT, sclerae white Cardiovascular: Regular, no M Lungs: Diminished breath sounds in left  base, bronchial breath sounds in right base, diffuse scattered wheezes Abdomen: Soft, NT, + BS Ext: Warm, no edema  LABS:  BMET Recent Labs  Lab 08/15/17 0700 08/15/17 1153  NA 137  --   K 5.6*  --   CL 107  --   CO2 21*  --   BUN 53*  --   CREATININE 2.48* 2.52*  GLUCOSE 234*  --     Electrolytes Recent Labs  Lab 08/15/17 0700  CALCIUM 8.9    CBC Recent Labs  Lab 08/15/17 0700  WBC 12.4*  HGB 12.5  HCT 39.5  PLT 237    Coag's No results for input(s): APTT, INR in the last 168 hours.  Sepsis Markers Recent Labs  Lab 08/15/17 0700 08/15/17 1153  LATICACIDVEN 2.0*  --   PROCALCITON  --  0.56    ABG Recent Labs  Lab 08/15/17 0701  PHART 7.26*  PCO2ART 46  PO2ART 118*    Liver Enzymes Recent Labs  Lab 08/15/17 0700  AST 130*  ALT 102*  ALKPHOS 74  BILITOT 0.8  ALBUMIN 3.2*    Cardiac Enzymes Recent Labs  Lab 08/15/17 0700 08/15/17 1153  TROPONINI 1.39* 6.25*    Glucose Recent Labs  Lab 08/15/17 1052 08/15/17 1244  GLUCAP 197* 190*    CXR: R >L pleural effusions     DISCUSSION: Acute respiratory failure with bilateral pleural effusions and a history that would be compatible with pneumonia (cough for a couple days, drenching sweats).  Suspect possible pneumonia.  Also, likely component of CHF.  ASSESSMENT / PLAN:  PULMONARY A: Former smoker with documented history of COPD Acute hypoxemic respiratory failure R >L pleural effusions Possible pneumonia Possible component of pulmonary edema P:   Continue BiPAP as needed Continue supplemental oxygen Nebulized steroids and bronchodilators Avoid systemic steroids for now Diuresis as permitted by BP and renal function DNI  CARDIOVASCULAR A:  History of CHF  History of peripheral vascular disease P:  Cardiac markers ordered Cardiology consultation requested  RENAL A:   AKI  CKD - baseline creatinine approximately 2.0 Hyperkalemia, mild P:   Nephrology  consultation requested Monitor BMET intermittently Monitor I/Os Correct electrolytes as indicated   GASTROINTESTINAL A:   No acute issues P:   SUP: PO famotidine (takes chronically) NPO while on BiPAP  HEMATOLOGIC A:   No acute issues P:  DVT px: SQ heparin Monitor CBC intermittently Transfuse per usual guidelines   INFECTIOUS A:   Suspected CAP P:   Monitor temp, WBC count Micro and abx as above PCT protocol  ENDOCRINE A:   DM 2 with hyperglycemia P:   Sens scale SSI q 4 hrs while NPO  NEUROLOGIC A:   Advanced dementia Mild acute encephalopathy P:   RASS goal: 0 Cont Memantine Minimize sedatives   FAMILY  - Updates: Daughter and son updated at bedside   Billy Fischeravid Simonds, MD PCCM service Mobile (  [phone] Pager [phone] 08/15/2017 1:51 PM

## 2017-08-15 NOTE — Progress Notes (Signed)
*  PRELIMINARY RESULTS* Echocardiogram 2D Echocardiogram has been performed.  Cristela BlueHege, Ambrose Wile 08/15/2017, 3:13 PM

## 2017-08-15 NOTE — Consult Note (Signed)
[facility]  CARDIOLOGY CONSULT NOTE  Patient ID: Cassandra Mccall: [id number] DOB/AGE: [birth year] 83 y.o.  Admit date: 08/15/2017 Referring Physician Dr. Imogene Burnhen Primary Physician Dr. Dareen Mccall Primary Cardiologist   Reason for Consultation elevated troponin  HPI: Pt is a 83 yo female with history of Alzheimer disease, previous hisstory of CHF per daughter treated in [place] in the past, hypertension, copd, ckd with a  Baseline creatinine of approximately 2.0, history of dm, pad who was admitted with several day history of increasing sob. Her daughter states she was "dancing" at a senior center later last week. In the er, her serum creatinine was 2.48 and cxr showed moderate right pleural effusion and small left pleural effusion with cardiomegaly. No pulmonary edema. EKG showed nsr with rbbb and lpfb with no ischemic changes. Initial serum troponin was 1.39 and increased to 6.25 thus far. Pt is on bipap and is not able to give history but daughter states she did have some pleuritic chest pain. She is currently hemodynamically stable with no support.   Review of Systems  Constitutional: Positive for malaise/fatigue.  HENT: Negative.   Eyes: Negative.   Respiratory: Positive for cough and shortness of breath.   Cardiovascular: Positive for chest pain.  Gastrointestinal: Negative.   Genitourinary: Negative.   Musculoskeletal: Negative.   Skin: Negative.   Neurological: Positive for weakness.  Endo/Heme/Allergies: Negative.   Psychiatric/Behavioral: Positive for memory loss.    Past Medical History:  Diagnosis Date  . Alzheimer disease   . CHF (congestive heart failure) (HCC)   . Chronic kidney disease   . COPD (chronic obstructive pulmonary disease) (HCC)   . Diabetes mellitus without complication (HCC)   . Hypertension   . PAD (peripheral artery disease) (HCC)   . PVD (peripheral vascular disease) (HCC)     Family History    Problem Relation Age of Onset  . COPD Mother   . Heart failure Mother   . Alzheimer's disease Father     Social History   Socioeconomic History  . Marital status: Widowed    Spouse name: Not on file  . Number of children: Not on file  . Years of education: Not on file  . Highest education level: Not on file  Social Needs  . Financial resource strain: Not on file  . Food insecurity - worry: Not on file  . Food insecurity - inability: Not on file  . Transportation needs - medical: Not on file  . Transportation needs - non-medical: Not on file  Occupational History  . Not on file  Tobacco Use  . Smoking status: Not on file  Substance and Sexual Activity  . Alcohol use: Not on file  . Drug use: Not on file  . Sexual activity: Not on file  Other Topics Concern  . Not on file  Social History Narrative  . Not on file    Past Surgical History:  Procedure Laterality Date  . VASCULAR SURGERY       Medications Prior to Admission  Medication Sig Dispense Refill Last Dose  . albuterol (PROVENTIL HFA) 108 (90 Base) MCG/ACT inhaler Inhale 2 puffs into the lungs every 6 (six) hours as needed.   08/14/2017 at Unknown time  . ALPRAZolam (XANAX) 0.25 MG tablet Take 0.5-1 tablets by mouth at bedtime.   1 08/14/2017 at Unknown time  . amLODipine (NORVASC) 2.5 MG tablet Take 1 tablet by mouth daily.   08/14/2017 at Unknown time  .  Bee Pollen 580 MG CAPS Take 2 capsules by mouth 2 (two) times daily.    08/14/2017 at Unknown time  . clopidogrel (PLAVIX) 75 MG tablet Take 1 tablet by mouth daily.   08/14/2017 at Unknown time  . fluticasone-salmeterol (ADVAIR HFA) 230-21 MCG/ACT inhaler Inhale 2 puffs into the lungs every 12 (twelve) hours as needed.   08/14/2017 at Unknown time  . furosemide (LASIX) 20 MG tablet Take 1 tablet by mouth daily.   08/14/2017 at Unknown time  . guaiFENesin 200 MG tablet Take 600 mg by mouth 2 (two) times daily.    08/14/2017 at Unknown time  . memantine (NAMENDA)  10 MG tablet Take 1 tablet by mouth 2 (two) times daily.   08/14/2017 at Unknown time  . metoprolol succinate (TOPROL-XL) 25 MG 24 hr tablet Take 1 tablet by mouth daily.   08/14/2017 at Unknown time  . Omega-3 Fatty Acids (FISH OIL PO) Take 1 capsule by mouth 2 (two) times daily.   08/14/2017 at Unknown time  . potassium chloride (K-DUR) 10 MEQ tablet Take 1 tablet by mouth daily as needed.   prn at prn  . ranitidine (ZANTAC) 75 MG tablet Take 150 mg by mouth daily.    08/14/2017 at Unknown time  . tiotropium (SPIRIVA) 18 MCG inhalation capsule Place 18 mcg into inhaler and inhale daily.   08/14/2017 at Unknown time  . vitamin B-12 (CYANOCOBALAMIN) 100 MCG tablet Take 100 mcg by mouth daily.   08/14/2017 at Unknown time  . mupirocin ointment (BACTROBAN) 2 % Apply 1 application topically 2 (two) times daily.  0 prn at prn    Physical Exam: Blood pressure (!) 142/61, pulse 73, temperature 98.5 F (36.9 C), temperature source Axillary, resp. rate (!) 26, height 5\' 7"  (1.702 m), weight 70.7 kg (155 lb 13.8 oz), SpO2 99 %.   Wt Readings from Last 1 Encounters:  08/15/17 70.7 kg (155 lb 13.8 oz)     General appearance: cooperative and slowed mentation Resp: diminished breath sounds bibasilar Cardio: regular rate and rhythm GI: soft, non-tender; bowel sounds normal; no masses,  no organomegaly Extremities: extremities normal, atraumatic, no cyanosis or edema Neurologic: Mental status: alertness: lethargic  Labs:   Lab Results  Component Value Date   WBC 12.4 (H) 08/15/2017   HGB 12.5 08/15/2017   HCT 39.5 08/15/2017   MCV 95.1 08/15/2017   PLT 237 08/15/2017    Recent Labs  Lab 08/15/17 0700 08/15/17 1153  NA 137  --   K 5.6*  --   CL 107  --   CO2 21*  --   BUN 53*  --   CREATININE 2.48* 2.52*  CALCIUM 8.9  --   PROT 8.3*  --   BILITOT 0.8  --   ALKPHOS 74  --   ALT 102*  --   AST 130*  --   GLUCOSE 234*  --    Lab Results  Component Value Date   TROPONINI 6.25 (HH)  08/15/2017       ASSESSMENT AND PLAN:  83 yo female with history of multiple medical problems including ckd, reported chf, HFpEF vs HFrEF, pvd, Alzheimers diseae and copd who presented to the er with progressive sob and was noted to have bilateral pleural effusions on cxr, right greater than left and elevated troponin. Pt is on Bipap and unable to give much history. Her daughter reports rapid decline in her funciotn with rapid increae in sob over the past few days. Has elevated  troponin. May be demand ischemia in face of hypoxia and dyspnea with actue on chronic renal insuffiency. Will review echo when available and contineu to carefully diurese following renal funciotn and electrolytes. Not candidate for cath at present given worsening renal function. Will readress this at time of discharge. Continue with emperic abx and gentle diiuresis with asa. Continue asa and plavix which she is on as outpatient for her carotid artery disease. Will change to high intensity statin for now.  Signed: Dalia Heading MD, [facility] 08/15/2017, 1:51 PM

## 2017-08-15 NOTE — ED Provider Notes (Addendum)
Patient presented with respiratory distress and is on BiPAP.  Potassium is somewhat elevated but we will give IV Lasix for volume overload which will likely help the potassium level as well.  Overall she appears to be improved on BiPAP.  Troponin is elevated which is likely demand related.  I will discussed with the hospitalist for admission.  Family is not sure that they would want dialysis if she does not improve with IV Lasix.  She is a DNR.   Cassandra Mccall, Jonathan E, MD 08/15/17 [id number]    Cassandra Mccall, Jonathan E, MD 08/15/17 [phone]

## 2017-08-15 NOTE — H&P (Signed)
[facility] - [place] at [facility]   PATIENT NAME: Cassandra Mccall    MR#:  [id number]  DATE OF BIRTH:  1928-12-05  DATE OF ADMISSION:  08/15/2017  PRIMARY CARE PHYSICIAN: Lauro Regulus, MD   REQUESTING/REFERRING PHYSICIAN: Emily Filbert, MD  CHIEF COMPLAINT:   Chief Complaint  Patient presents with  . Shortness of Breath   Worsening shortness of breath and the cough.  Readings. HISTORY OF PRESENT ILLNESS:  Cassandra Mccall  is a 83 y.o. female with a known history of multiple medical problems as below.  The patient was sent to the ED due to worsening shortness of breath and cough.  The patient is started to have a cough and shortness of breath 3 days ago.  She received antibiotics as outpatient without improvement.  She was sent to the ED today due to worsening symptoms.  She has Alzheimer's dementia and is a poor historian.  She was found hypoxia with respiratory distress and put on BiPAP, chest x-ray shows pleural effusion.  She also was found elevated creatinine and potassium 5.6.  PAST MEDICAL HISTORY:   Past Medical History:  Diagnosis Date  . Alzheimer disease   . CHF (congestive heart failure) (HCC)   . Chronic kidney disease   . COPD (chronic obstructive pulmonary disease) (HCC)   . Diabetes mellitus without complication (HCC)   . Hypertension   . PAD (peripheral artery disease) (HCC)   . PVD (peripheral vascular disease) (HCC)     PAST SURGICAL HISTORY:   Past Surgical History:  Procedure Laterality Date  . VASCULAR SURGERY      SOCIAL HISTORY:   Social History   Tobacco Use  . Smoking status: Not on file  Substance Use Topics  . Alcohol use: Not on file    FAMILY HISTORY:   Family History  Problem Relation Age of Onset  . COPD Mother   . Heart failure Mother   . Alzheimer's disease Father     DRUG ALLERGIES:   Allergies  Allergen Reactions  . Sulfa Antibiotics     REVIEW OF SYSTEMS:   Review of Systems    Unable to perform ROS: Dementia    MEDICATIONS AT HOME:   Prior to Admission medications   Medication Sig Start Date End Date Taking? Authorizing Provider  albuterol (PROVENTIL HFA) 108 (90 Base) MCG/ACT inhaler Inhale 2 puffs into the lungs every 6 (six) hours as needed. 08/12/17  Yes [provider]  ALPRAZolam Prudy Feeler) 0.25 MG tablet Take 0.5-1 tablets by mouth at bedtime.  08/09/17  Yes [provider]  amLODipine (NORVASC) 2.5 MG tablet Take 1 tablet by mouth daily. 07/05/17  Yes [provider]  Bee Pollen 580 MG CAPS Take 2 capsules by mouth 2 (two) times daily.    Yes [provider]  clopidogrel (PLAVIX) 75 MG tablet Take 1 tablet by mouth daily.   Yes [provider]  fluticasone-salmeterol (ADVAIR HFA) 230-21 MCG/ACT inhaler Inhale 2 puffs into the lungs every 12 (twelve) hours as needed. 08/08/17 08/08/18 Yes [provider]  furosemide (LASIX) 20 MG tablet Take 1 tablet by mouth daily. 08/09/17  Yes [provider]  guaiFENesin 200 MG tablet Take 600 mg by mouth 2 (two) times daily.    Yes [provider]  memantine (NAMENDA) 10 MG tablet Take 1 tablet by mouth 2 (two) times daily. 02/19/17  Yes [provider]  metoprolol succinate (TOPROL-XL) 25 MG 24 hr tablet Take 1 tablet  by mouth daily. 07/05/17  Yes [provider]  Omega-3 Fatty Acids (FISH OIL PO) Take 1 capsule by mouth 2 (two) times daily.   Yes [provider]  potassium chloride (K-DUR) 10 MEQ tablet Take 1 tablet by mouth daily as needed. 12/11/16  Yes [provider]  ranitidine (ZANTAC) 75 MG tablet Take 150 mg by mouth daily.    Yes [provider]  tiotropium (SPIRIVA) 18 MCG inhalation capsule Place 18 mcg into inhaler and inhale daily.   Yes [provider]  vitamin B-12 (CYANOCOBALAMIN) 100 MCG tablet Take 100 mcg by mouth daily.   Yes [provider]  mupirocin ointment  (BACTROBAN) 2 % Apply 1 application topically 2 (two) times daily. 08/08/17   [provider]      VITAL SIGNS:  Blood pressure 97/65, pulse 74, temperature 97.9 F (36.6 C), temperature source Oral, resp. rate (!) 22, height 5\' 5"  (1.651 m), weight 160 lb 4.4 oz (72.7 kg), SpO2 94 %.  PHYSICAL EXAMINATION:  Physical Exam  GENERAL:  83 y.o.-year-old patient lying in the bed, on BiPAP EYES: Pupils equal, round, reactive to light and accommodation. No scleral icterus. Extraocular muscles intact.  HEENT: Head atraumatic, normocephalic. NECK:  Supple, no jugular venous distention. No thyroid enlargement, no tenderness.  LUNGS: Normal breath sounds bilaterally, no wheezing, but has rales. No use of accessory muscles of respiration.  CARDIOVASCULAR: S1, S2 normal. No murmurs, rubs, or gallops.  ABDOMEN: Soft, nontender, nondistended. Bowel sounds present. No organomegaly or mass.  EXTREMITIES: No cyanosis, or clubbing.  But has bilateral leg edema. NEUROLOGIC: Cranial nerves II through XII are intact. Muscle strength 4/5 in all extremities. Sensation intact. Gait not checked.  PSYCHIATRIC: The patient is awake but demented.  SKIN: No obvious rash, lesion, or ulcer.   LABORATORY PANEL:   CBC Recent Labs  Lab 08/15/17 0700  WBC 12.4*  HGB 12.5  HCT 39.5  PLT 237   ------------------------------------------------------------------------------------------------------------------  Chemistries  Recent Labs  Lab 08/15/17 0700  NA 137  K 5.6*  CL 107  CO2 21*  GLUCOSE 234*  BUN 53*  CREATININE 2.48*  CALCIUM 8.9  AST 130*  ALT 102*  ALKPHOS 74  BILITOT 0.8   ------------------------------------------------------------------------------------------------------------------  Cardiac Enzymes Recent Labs  Lab 08/15/17 0700  TROPONINI 1.39*    ------------------------------------------------------------------------------------------------------------------  RADIOLOGY:  Dg Chest Port 1 View  Result Date: 08/15/2017 CLINICAL DATA:  Shortness of breath. EXAM: PORTABLE CHEST 1 VIEW COMPARISON:  Report of chest x-ray dated 08/12/2017 FINDINGS: There is a moderate right effusion and a small left effusion. Cardiomegaly. Pulmonary vascularity is within normal limits. No discrete pulmonary edema. Aortic atherosclerosis. No acute bone abnormality. IMPRESSION: Persistent bilateral effusions, right greater than left. Cardiomegaly. No discrete pulmonary edema. Electronically Signed   By: Francene BoyersJames  Maxwell M.D.   On: 08/15/2017 07:17      IMPRESSION AND PLAN:   Acute on chronic respiratory failure hypoxia due to acute on chronic CHF and pleural effusion. Patient will be admitted to stepdown unit. Started Lasix IV 40 mg twice daily, echocardiogram and cardiology consult.  Intensivist consult.  Continue BiPAP and nebulizer.  Elevated troponin.  Possible due to demanding ischemia, given aspirin and Plavix, follow-up with troponin and cardiology consult.  Acute on chronic renal failure.  Follow-up BMP while on Lasix, nephrology consult.  Hyperkalemia.  Give Kayexalate in the follow-up potassium level.  Hold potassium supplement.  Lactic acidosis.  Due to above, treatment as above and follow-up lactic acid  level.  Leukocytosis.  Unclear etiology, check urinalysis.  Abnormal liver function tests.  Unclear etiology, follow-up BMP.  COPD.  Stable continue home nebulizer.  PAD.  Continue aspirin and Plavix.  Alzheimer's disease.  Aspiration in the fall precaution.  Paged intensivist. All the records are reviewed and case discussed with ED provider. Management plans discussed with the patient's daughter and son and they are in agreement.  CODE STATUS: DNR TOTAL CRITICAL TIME TAKING CARE OF THIS PATIENT:  62 minutes.    Shaune PollackQing Yannely Kintzel M.D on  08/15/2017 at 9:28 AM  Between 7am to 6pm - Pager - [phone]  After 6pm go to [email or web address] - Scientist, research (life sciences)password [email or web address] [facility]  [facility] [place] [facility]  Office  [phone]  CC: Primary care physician; Lauro RegulusAnderson, Marshall W, MD   Note: This dictation was prepared with Dragon dictation along with smaller phrase technology. Any transcriptional errors that result from this process are unin

## 2017-08-16 ENCOUNTER — Inpatient Hospital Stay: Payer: Medicare Other

## 2017-08-16 LAB — GLUCOSE, CAPILLARY
GLUCOSE-CAPILLARY: 138 mg/dL — AB (ref 65–99)
GLUCOSE-CAPILLARY: 119 mg/dL — AB (ref 65–99)
Glucose-Capillary: 112 mg/dL — ABNORMAL HIGH (ref 65–99)
Glucose-Capillary: 120 mg/dL — ABNORMAL HIGH (ref 65–99)
Glucose-Capillary: 113 mg/dL — ABNORMAL HIGH (ref 65–99)

## 2017-08-16 LAB — BLOOD CULTURE ID PANEL (REFLEXED)
ACINETOBACTER BAUMANNII: NOT DETECTED
CANDIDA ALBICANS: NOT DETECTED
CANDIDA KRUSEI: NOT DETECTED
CANDIDA PARAPSILOSIS: NOT DETECTED
Candida glabrata: NOT DETECTED
Candida tropicalis: NOT DETECTED
ENTEROBACTERIACEAE SPECIES: NOT DETECTED
ESCHERICHIA COLI: NOT DETECTED
Enterobacter cloacae complex: NOT DETECTED
Enterococcus species: NOT DETECTED
Haemophilus influenzae: NOT DETECTED
KLEBSIELLA OXYTOCA: NOT DETECTED
Klebsiella pneumoniae: NOT DETECTED
LISTERIA MONOCYTOGENES: NOT DETECTED
METHICILLIN RESISTANCE: DETECTED — AB
Neisseria meningitidis: NOT DETECTED
PSEUDOMONAS AERUGINOSA: NOT DETECTED
Proteus species: NOT DETECTED
STREPTOCOCCUS AGALACTIAE: NOT DETECTED
STREPTOCOCCUS PNEUMONIAE: NOT DETECTED
STREPTOCOCCUS PYOGENES: NOT DETECTED
Serratia marcescens: NOT DETECTED
Staphylococcus aureus (BCID): NOT DETECTED
Staphylococcus species: DETECTED — AB
Streptococcus species: NOT DETECTED

## 2017-08-16 LAB — CBC
HCT: 33.2 % — ABNORMAL LOW (ref 35.0–47.0)
HEMOGLOBIN: 10.9 g/dL — AB (ref 12.0–16.0)
MCH: 30.7 pg (ref 26.0–34.0)
MCHC: 32.9 g/dL (ref 32.0–36.0)
MCV: 93.5 fL (ref 80.0–100.0)
PLATELETS: 202 10*3/uL (ref 150–440)
RBC: 3.55 MIL/uL — ABNORMAL LOW (ref 3.80–5.20)
RDW: 13.9 % (ref 11.5–14.5)
WBC: 7 10*3/uL (ref 3.6–11.0)

## 2017-08-16 LAB — URINE CULTURE: Culture: NO GROWTH

## 2017-08-16 LAB — BASIC METABOLIC PANEL
ANION GAP: 12 (ref 5–15)
BUN: 57 mg/dL — ABNORMAL HIGH (ref 6–20)
CALCIUM: 7.9 mg/dL — AB (ref 8.9–10.3)
CO2: 22 mmol/L (ref 22–32)
CREATININE: 2.83 mg/dL — AB (ref 0.44–1.00)
Chloride: 108 mmol/L (ref 101–111)
GFR, EST AFRICAN AMERICAN: 16 mL/min — AB (ref >60)
GFR, EST NON AFRICAN AMERICAN: 14 mL/min — AB (ref >60)
GLUCOSE: 125 mg/dL — AB (ref 65–99)
Potassium: 3.4 mmol/L — ABNORMAL LOW (ref 3.5–5.1)
Sodium: 142 mmol/L (ref 135–145)

## 2017-08-16 LAB — PROCALCITONIN: PROCALCITONIN: 1.16 ng/mL

## 2017-08-16 LAB — TROPONIN I: Troponin I: 7.67 ng/mL (ref <0.03)

## 2017-08-16 MED ORDER — MAGNESIUM OXIDE 400 (241.3 MG) MG PO TABS
400.0000 mg | ORAL_TABLET | Freq: Every day | ORAL | Status: DC
  Administered 2017-08-16 – 2017-08-21 (×6): 400 mg via ORAL
  Filled 2017-08-16 (×6): qty 1

## 2017-08-16 MED ORDER — INSULIN ASPART 100 UNIT/ML ~~LOC~~ SOLN: 0.0000 [IU] | Freq: Every day | SUBCUTANEOUS | Status: DC

## 2017-08-16 MED ORDER — POTASSIUM CHLORIDE CRYS ER 20 MEQ PO TBCR
40.0000 meq | EXTENDED_RELEASE_TABLET | Freq: Once | ORAL | Status: AC
  Administered 2017-08-16: 40 meq via ORAL
  Filled 2017-08-16: qty 2

## 2017-08-16 MED ORDER — METOPROLOL TARTRATE 25 MG PO TABS
12.5000 mg | ORAL_TABLET | Freq: Two times a day (BID) | ORAL | Status: DC
  Administered 2017-08-16 (×2): 12.5 mg via ORAL
  Filled 2017-08-16 (×2): qty 1

## 2017-08-16 MED ORDER — INSULIN ASPART 100 UNIT/ML ~~LOC~~ SOLN
0.0000 [IU] | Freq: Three times a day (TID) | SUBCUTANEOUS | Status: DC
  Administered 2017-08-16: 2 [IU] via SUBCUTANEOUS
  Administered 2017-08-17 (×2): 3 [IU] via SUBCUTANEOUS
  Administered 2017-08-18 – 2017-08-19 (×3): 2 [IU] via SUBCUTANEOUS
  Administered 2017-08-20 – 2017-08-21 (×2): 3 [IU] via SUBCUTANEOUS
  Filled 2017-08-16 (×8): qty 1

## 2017-08-16 NOTE — Progress Notes (Signed)
[facility] - [place] [facility]   PATIENT NAME: Cassandra DanielMargie Mccall    MR#:  [id number]  DATE OF BIRTH:  02-21-1929  SUBJECTIVE:  CHIEF COMPLAINT:   Chief Complaint  Patient presents with  . Shortness of Breath   Patient is on oxygen by nasal cannula 2 L, off BiPAP. REVIEW OF SYSTEMS:  Review of Systems  Unable to perform ROS: Dementia    DRUG ALLERGIES:   Allergies  Allergen Reactions  . Sulfa Antibiotics    VITALS:  Blood pressure (!) 161/73, pulse 88, temperature 98.4 F (36.9 C), temperature source Oral, resp. rate (!) 44, height 5\' 7"  (1.702 m), weight 155 lb 13.8 oz (70.7 kg), SpO2 96 %. PHYSICAL EXAMINATION:  Physical Exam  Constitutional: She is well-developed, well-nourished, and in no distress.  HENT:  Head: Normocephalic.  Mouth/Throat: Oropharynx is clear and moist.  Eyes: Conjunctivae and EOM are normal. Pupils are equal, round, and reactive to light. No scleral icterus.  Neck: Normal range of motion. Neck supple. No JVD present. No tracheal deviation present.  Cardiovascular: Normal rate, regular rhythm and normal heart sounds. Exam reveals no gallop.  No murmur heard. Pulmonary/Chest: Effort normal. No respiratory distress. She has no wheezes. She has rales.  Abdominal: Soft. Bowel sounds are normal. She exhibits no distension. There is no tenderness. There is no rebound.  Musculoskeletal: She exhibits edema. She exhibits no tenderness.  Neurological: No cranial nerve deficit.  AAOx1  Skin: No rash noted. No erythema.  Psychiatric:  The patient is awake but demented.   LABORATORY PANEL:  Female CBC Recent Labs  Lab 08/16/17 0454  WBC 7.0  HGB 10.9*  HCT 33.2*  PLT 202   ------------------------------------------------------------------------------------------------------------------ Chemistries  Recent Labs  Lab 08/15/17 0700  08/16/17 0454  NA 137  --  142  K 5.6*   < > 3.4*  CL 107  --  108  CO2 21*  --  22    GLUCOSE 234*  --  125*  BUN 53*  --  57*  CREATININE 2.48*   < > 2.83*  CALCIUM 8.9  --  7.9*  AST 130*  --   --   ALT 102*  --   --   ALKPHOS 74  --   --   BILITOT 0.8  --   --    < > = values in this interval not displayed.   RADIOLOGY:  Dg Chest Port 1 View  Result Date: 08/16/2017 CLINICAL DATA:  Respiratory failure EXAM: PORTABLE CHEST 1 VIEW COMPARISON:  08/15/2017 FINDINGS: Cardiac shadow is stable. Aortic calcifications are again seen. Bilateral pleural effusions are again noted and stable. Likely underlying infiltrate/atelectasis is present. No acute bony abnormality is seen. IMPRESSION: Stable appearance of the chest with bilateral pleural effusions Electronically Signed   By: Alcide CleverMark  Lukens M.D.   On: 08/16/2017 07:45   ASSESSMENT AND PLAN:   Acute on chronic respiratory failure hypoxia due to acute on chronic CHF with pleural effusion and possible PNA. Off BiPAP, continue oxygen by nasal cannula, NEB PRN.  Acute on chronic diastolic CHF with pleural effusion. Echo: Systolic function was normal. The estimated ejection   fraction was in the range of 50% to 55%. Continue Lasix IV 40 mg twice daily.  Possible pneumonia.  Continue Zithromax and Rocephin, follow-up cultures.  Robitussin as needed  UTI.  Continue antibiotics.  Elevated troponin.  Possible due to demanding ischemia, continue aspirin and Plavix, changed to high intensity statin per Dr.  Fath.  Acute on chronic renal failure stage IV.  Follow-up BMP while on Lasix, follow BMP and no indication for dialysis per Dr. Thedore MinsSingh.  Hyperkalemia.    Improved with Kayexalate.  Lactic acidosis.  Due to above, treatment as above.  Improved.  Leukocytosis.    Improved.  Abnormal liver function tests.  Unclear etiology, follow-up CMP.  COPD.  Stable continue home nebulizer.  PAD.  Continue aspirin and Plavix.  Diabetes.  Sliding scale.  Alzheimer's disease.  Aspiration in the fall precaution.  Discussed  with Dr. Sung AmabileSimonds. All the records are reviewed and case discussed with Care Management/Social Worker. Management plans discussed with the patient, and they are in agreement.  CODE STATUS: DNR  TOTAL TIME TAKING CARE OF THIS PATIENT: 38 minutes.   More than 50% of the time was spent in counseling/coordination of care: YES  POSSIBLE D/C IN 3 DAYS, DEPENDING ON CLINICAL CONDITION.   Shaune PollackQing Chelbie Jarnagin M.D on 08/16/2017 at 4:09 PM  Between 7am to 6pm - Pager - [phone]  After 6pm go to [email or web address] - Therapist, nutritionalpassword [email or web address]  [facility] [place] [facility]

## 2017-08-16 NOTE — Consult Note (Signed)
Date: 08/16/2017                  Patient Name:  Cassandra Mccall  MRN: [id number]  DOB: Feb 16, 1929  Age / Sex: 83 y.o., female         PCP: Lauro Regulus, MD                 Service Requesting Consult: IM/ Shaune Pollack, MD                 Reason for Consult: ARF            History of Present Illness: Patient is a 83 y.o. female with medical problems of type 2 diabetes, insulin-dependent, depression, chronic bronchitis, peripheral vascular disease, Alzheimer's dementia, chronic kidney disease, who was admitted to [facility] on 08/15/2017 for evaluation of Shortness if breath.  Patient had cough and shortness of breath at home.  She was getting oral antibiotics (Z-Pak).  According to admission and ER notes, she had generalized weakness and difficulty breathing therefore was brought to the emergency room for evaluation.  Upon arrival her oxygen saturation was 85% on room air Review of outpatient records show that her baseline creatinine appears to be 2.2/GFR 21 (dec 2018) suggesting chronic kidney disease stage IV Admission creatinine was 2.48, today's creatinine has further increased to 2.5 to Patient also has elevated troponin, and has been evaluated by cardiology for non-STEMI   Medications: Outpatient medications: Medications Prior to Admission  Medication Sig Dispense Refill Last Dose  . albuterol (PROVENTIL HFA) 108 (90 Base) MCG/ACT inhaler Inhale 2 puffs into the lungs every 6 (six) hours as needed.   08/14/2017 at Unknown time  . ALPRAZolam (XANAX) 0.25 MG tablet Take 0.5-1 tablets by mouth at bedtime.   1 08/14/2017 at Unknown time  . amLODipine (NORVASC) 2.5 MG tablet Take 1 tablet by mouth daily.   08/14/2017 at Unknown time  . Bee Pollen 580 MG CAPS Take 2 capsules by mouth 2 (two) times daily.    08/14/2017 at Unknown time  . clopidogrel (PLAVIX) 75 MG tablet Take 1 tablet by mouth daily.   08/14/2017 at Unknown time  . fluticasone-salmeterol (ADVAIR HFA) 230-21 MCG/ACT  inhaler Inhale 2 puffs into the lungs every 12 (twelve) hours as needed.   08/14/2017 at Unknown time  . furosemide (LASIX) 20 MG tablet Take 1 tablet by mouth daily.   08/14/2017 at Unknown time  . guaiFENesin 200 MG tablet Take 600 mg by mouth 2 (two) times daily.    08/14/2017 at Unknown time  . memantine (NAMENDA) 10 MG tablet Take 1 tablet by mouth 2 (two) times daily.   08/14/2017 at Unknown time  . metoprolol succinate (TOPROL-XL) 25 MG 24 hr tablet Take 1 tablet by mouth daily.   08/14/2017 at Unknown time  . Omega-3 Fatty Acids (FISH OIL PO) Take 1 capsule by mouth 2 (two) times daily.   08/14/2017 at Unknown time  . potassium chloride (K-DUR) 10 MEQ tablet Take 1 tablet by mouth daily as needed.   prn at prn  . ranitidine (ZANTAC) 75 MG tablet Take 150 mg by mouth daily.    08/14/2017 at Unknown time  . tiotropium (SPIRIVA) 18 MCG inhalation capsule Place 18 mcg into inhaler and inhale daily.   08/14/2017 at Unknown time  . vitamin B-12 (CYANOCOBALAMIN) 100 MCG tablet Take 100 mcg by mouth daily.   08/14/2017 at Unknown time  . mupirocin ointment (BACTROBAN) 2 % Apply 1 application topically  2 (two) times daily.  0 prn at prn    Current medications: Current Facility-Administered Medications  Medication Dose Route Frequency Provider Last Rate Last Dose  . 0.9 %  sodium chloride infusion  250 mL Intravenous PRN Shaune Pollackhen, Qing, MD      . acetaminophen (TYLENOL) tablet 650 mg  650 mg Oral Q6H PRN Shaune Pollackhen, Qing, MD      . albuterol (PROVENTIL) (2.5 MG/3ML) 0.083% nebulizer solution 2.5 mg  2.5 mg Nebulization Q2H PRN Shaune Pollackhen, Qing, MD      . ALPRAZolam Prudy Feeler(XANAX) tablet 0.125-0.25 mg  0.125-0.25 mg Oral QHS Shaune Pollackhen, Qing, MD      . aspirin EC tablet 81 mg  81 mg Oral Daily Shaune Pollackhen, Qing, MD      . atorvastatin (LIPITOR) tablet 80 mg  80 mg Oral q1800 Dalia HeadingFath, Kenneth A, MD   80 mg at 08/15/17 1757  . azithromycin (ZITHROMAX) 250 mg in dextrose 5 % 125 mL IVPB  250 mg Intravenous Q24H Merwyn KatosSimonds, David B, MD    Stopped at 08/15/17 1525  . bisacodyl (DULCOLAX) EC tablet 5 mg  5 mg Oral Daily PRN Shaune Pollackhen, Qing, MD      . cefTRIAXone (ROCEPHIN) 1 g in dextrose 5 % 50 mL IVPB  1 g Intravenous Q24H Merwyn KatosSimonds, David B, MD   Stopped at 08/15/17 1525  . chlorhexidine (PERIDEX) 0.12 % solution 15 mL  15 mL Mouth Rinse BID Merwyn KatosSimonds, David B, MD      . clopidogrel (PLAVIX) tablet 75 mg  75 mg Oral Daily Shaune Pollackhen, Qing, MD   75 mg at 08/15/17 1223  . famotidine (PEPCID) tablet 10 mg  10 mg Oral Daily Shaune Pollackhen, Qing, MD   10 mg at 08/15/17 1223  . furosemide (LASIX) injection 40 mg  40 mg Intravenous Q12H Merwyn KatosSimonds, David B, MD   40 mg at 08/15/17 2345  . guaiFENesin-dextromethorphan (ROBITUSSIN DM) 100-10 MG/5ML syrup 5 mL  5 mL Oral Q4H PRN Shaune Pollackhen, Qing, MD      . heparin injection 5,000 Units  5,000 Units Subcutaneous Q8H Shaune Pollackhen, Qing, MD   5,000 Units at 08/16/17 [phone]  . insulin aspart (novoLOG) injection 0-15 Units  0-15 Units Subcutaneous TID WC Merwyn KatosSimonds, David B, MD      . insulin aspart (novoLOG) injection 0-5 Units  0-5 Units Subcutaneous QHS Merwyn KatosSimonds, David B, MD      . ipratropium-albuterol (DUONEB) 0.5-2.5 (3) MG/3ML nebulizer solution 3 mL  3 mL Nebulization Q6H Merwyn KatosSimonds, David B, MD   3 mL at 08/16/17 0754  . magnesium oxide (MAG-OX) tablet 400 mg  400 mg Oral Daily Dalia HeadingFath, Kenneth A, MD      . MEDLINE mouth rinse  15 mL Mouth Rinse q12n4p Merwyn KatosSimonds, David B, MD   15 mL at 08/15/17 1607  . memantine (NAMENDA) tablet 10 mg  10 mg Oral BID Shaune Pollackhen, Qing, MD      . metoprolol tartrate (LOPRESSOR) tablet 12.5 mg  12.5 mg Oral BID Dalia HeadingFath, Kenneth A, MD      . ondansetron [facility](ZOFRAN) tablet 4 mg  4 mg Oral Q6H PRN Shaune Pollackhen, Qing, MD       Or  . ondansetron [facility](ZOFRAN) injection 4 mg  4 mg Intravenous Q6H PRN Shaune Pollackhen, Qing, MD      . potassium chloride SA (K-DUR,KLOR-CON) CR tablet 40 mEq  40 mEq Oral Once Merwyn KatosSimonds, David B, MD      . senna-docusate (Senokot-S) tablet 1 tablet  1 tablet Oral QHS PRN Shaune Pollackhen, Qing, MD      .  sodium chloride flush (NS) 0.9 %  injection 3 mL  3 mL Intravenous Q12H Shaune Pollack, MD   3 mL at 08/15/17 2146  . sodium chloride flush (NS) 0.9 % injection 3 mL  3 mL Intravenous PRN Shaune Pollack, MD          Allergies: Allergies  Allergen Reactions  . Sulfa Antibiotics       Past Medical History: Past Medical History:  Diagnosis Date  . Alzheimer disease   . CHF (congestive heart failure) (HCC)   . Chronic kidney disease   . COPD (chronic obstructive pulmonary disease) (HCC)   . Diabetes mellitus without complication (HCC)   . Hypertension   . PAD (peripheral artery disease) (HCC)   . PVD (peripheral vascular disease) (HCC)      Past Surgical History: Past Surgical History:  Procedure Laterality Date  . VASCULAR SURGERY       Family History: Family History  Problem Relation Age of Onset  . COPD Mother   . Heart failure Mother   . Alzheimer's disease Father      Social History: Social History   Socioeconomic History  . Marital status: Widowed    Spouse name: Not on file  . Number of children: Not on file  . Years of education: Not on file  . Highest education level: Not on file  Social Needs  . Financial resource strain: Not on file  . Food insecurity - worry: Not on file  . Food insecurity - inability: Not on file  . Transportation needs - medical: Not on file  . Transportation needs - non-medical: Not on file  Occupational History  . Not on file  Tobacco Use  . Smoking status: Former Smoker    Types: Cigarettes  . Smokeless tobacco: Never Used  Substance and Sexual Activity  . Alcohol use: No    Frequency: Never  . Drug use: No  . Sexual activity: Not Currently  Other Topics Concern  . Not on file  Social History Narrative  . Not on file     Review of Systems: Limited due to patient's respiratory distress Gen: Denies fever, weight changes HEENT: No vision or hearing problems CV: Chest pain at home none at present Resp: Difficulty breathing, cough GI: No nausea or  vomiting GU : No problems reported with voiding MS: No complaints Derm:  No complaints Psych: No complaints Heme: No complaints Neuro: No complaints Endocrine.  No complaints  Vital Signs: Blood pressure 134/66, pulse 89, temperature 99.2 F (37.3 C), temperature source Axillary, resp. rate (!) 30, height 5\' 7"  (1.702 m), weight 70.7 kg (155 lb 13.8 oz), SpO2 97 %.   Intake/Output Summary (Last 24 hours) at 08/16/2017 1022 Last data filed at 08/15/2017 1800 Gross per 24 hour  Intake 175 ml  Output 800 ml  Net -625 ml    Weight trends: Filed Weights   08/15/17 1191 08/15/17 1116  Weight: 72.7 kg (160 lb 4.4 oz) 70.7 kg (155 lb 13.8 oz)    Physical Exam: General:  Mild distress, ill-appearing  HEENT  anicteric, moist oral mucous membranes  Neck:  Supple   Lungs: bilateral diffuse crackles  Heart::  Irregular, tachycardic  Abdomen:  Soft, nontender  Extremities:  No peripheral edema  Neurologic:  Alert, able to answer questions  Skin:  No acute rashes             Lab results: Basic Metabolic Panel: Recent Labs  Lab 08/15/17 0700 08/15/17 1153 08/15/17 1932  08/16/17 0454  NA 137  --   --  142  K 5.6*  --  3.9 3.4*  CL 107  --   --  108  CO2 21*  --   --  22  GLUCOSE 234*  --   --  125*  BUN 53*  --   --  57*  CREATININE 2.48* 2.52*  --  2.83*  CALCIUM 8.9  --   --  7.9*    Liver Function Tests: Recent Labs  Lab 08/15/17 0700  AST 130*  ALT 102*  ALKPHOS 74  BILITOT 0.8  PROT 8.3*  ALBUMIN 3.2*   No results for input(s): LIPASE, AMYLASE in the last 168 hours. No results for input(s): AMMONIA in the last 168 hours.  CBC: Recent Labs  Lab 08/15/17 0700 08/16/17 0454  WBC 12.4* 7.0  NEUTROABS 11.4*  --   HGB 12.5 10.9*  HCT 39.5 33.2*  MCV 95.1 93.5  PLT 237 202    Cardiac Enzymes: Recent Labs  Lab 08/16/17 0454  TROPONINI 7.67*    BNP: Invalid input(s): POCBNP  CBG: Recent Labs  Lab 08/15/17 1550 08/15/17 1944  08/15/17 2339 08/16/17 0404 08/16/17 0716  GLUCAP 138* 185* 138* 112* 120*    Microbiology: Recent Results (from the past 720 hour(s))  Blood Culture (routine x 2)     Status: None (Preliminary result)   Collection Time: 08/15/17  7:00 AM  Result Value Ref Range Status   Specimen Description BLOOD LEFT FINGER  Final   Special Requests BOTTLES DRAWN AEROBIC AND ANAEROBIC BCAV  Final   Culture  Setup Time   Final    Organism ID to follow GRAM POSITIVE COCCI ANAEROBIC BOTTLE ONLY CRITICAL RESULT CALLED TO, READ BACK BY AND VERIFIED WITH: SHEEMA HALLAJI ON 08/16/17 AT 6213 QSD Performed at [facility], [address] [place], [place] [address]    Culture GRAM POSITIVE COCCI  Final   Report Status PENDING  Incomplete  Blood Culture ID Panel (Reflexed)     Status: Abnormal   Collection Time: 08/15/17  7:00 AM  Result Value Ref Range Status   Enterococcus species NOT DETECTED NOT DETECTED Final   Listeria monocytogenes NOT DETECTED NOT DETECTED Final   Staphylococcus species DETECTED (A) NOT DETECTED Final    Comment: Methicillin (oxacillin) resistant coagulase negative staphylococcus. Possible blood culture contaminant (unless isolated from more than one blood culture draw or clinical case suggests pathogenicity). No antibiotic treatment is indicated for blood  culture contaminants. CRITICAL RESULT CALLED TO, READ BACK BY AND VERIFIED WITH: SHEEMA HALLAJI ON 08/16/17 AT 8469 QSD    Staphylococcus aureus NOT DETECTED NOT DETECTED Final   Methicillin resistance DETECTED (A) NOT DETECTED Final    Comment: CRITICAL RESULT CALLED TO, READ BACK BY AND VERIFIED WITH: SHEEMA HALLAJI ON 08/16/17 AT 0823 QSD    Streptococcus species NOT DETECTED NOT DETECTED Final   Streptococcus agalactiae NOT DETECTED NOT DETECTED Final   Streptococcus pneumoniae NOT DETECTED NOT DETECTED Final   Streptococcus pyogenes NOT DETECTED NOT DETECTED Final   Acinetobacter baumannii NOT DETECTED NOT  DETECTED Final   Enterobacteriaceae species NOT DETECTED NOT DETECTED Final   Enterobacter cloacae complex NOT DETECTED NOT DETECTED Final   Escherichia coli NOT DETECTED NOT DETECTED Final   Klebsiella oxytoca NOT DETECTED NOT DETECTED Final   Klebsiella pneumoniae NOT DETECTED NOT DETECTED Final   Proteus species NOT DETECTED NOT DETECTED Final   Serratia marcescens NOT DETECTED NOT DETECTED Final  Haemophilus influenzae NOT DETECTED NOT DETECTED Final   Neisseria meningitidis NOT DETECTED NOT DETECTED Final   Pseudomonas aeruginosa NOT DETECTED NOT DETECTED Final   Candida albicans NOT DETECTED NOT DETECTED Final   Candida glabrata NOT DETECTED NOT DETECTED Final   Candida krusei NOT DETECTED NOT DETECTED Final   Candida parapsilosis NOT DETECTED NOT DETECTED Final   Candida tropicalis NOT DETECTED NOT DETECTED Final    Comment: Performed at [facility], [address] [place], [place] [address]  MRSA PCR Screening     Status: None   Collection Time: 08/15/17 10:51 AM  Result Value Ref Range Status   MRSA by PCR NEGATIVE NEGATIVE Final    Comment:        The GeneXpert MRSA Assay (FDA approved for NASAL specimens only), is one component of a comprehensive MRSA colonization surveillance program. It is not intended to diagnose MRSA infection nor to guide or monitor treatment for MRSA infections. Performed at [facility], [address] [place], [place] [address]      Coagulation Studies: No results for input(s): LABPROT, INR in the last 72 hours.  Urinalysis: Recent Labs    08/15/17 1331  COLORURINE YELLOW*  LABSPEC 1.012  PHURINE 5.0  GLUCOSEU 150*  HGBUR SMALL*  BILIRUBINUR NEGATIVE  KETONESUR NEGATIVE  PROTEINUR 100*  NITRITE NEGATIVE  LEUKOCYTESUR LARGE*        Imaging: Dg Chest Port 1 View  Result Date: 08/16/2017 CLINICAL DATA:  Respiratory failure EXAM: PORTABLE CHEST 1 VIEW COMPARISON:  08/15/2017 FINDINGS: Cardiac shadow  is stable. Aortic calcifications are again seen. Bilateral pleural effusions are again noted and stable. Likely underlying infiltrate/atelectasis is present. No acute bony abnormality is seen. IMPRESSION: Stable appearance of the chest with bilateral pleural effusions Electronically Signed   By: Alcide CleverMark  Lukens M.D.   On: 08/16/2017 07:45   Dg Chest Port 1 View  Result Date: 08/15/2017 CLINICAL DATA:  Shortness of breath. EXAM: PORTABLE CHEST 1 VIEW COMPARISON:  Report of chest x-ray dated 08/12/2017 FINDINGS: There is a moderate right effusion and a small left effusion. Cardiomegaly. Pulmonary vascularity is within normal limits. No discrete pulmonary edema. Aortic atherosclerosis. No acute bone abnormality. IMPRESSION: Persistent bilateral effusions, right greater than left. Cardiomegaly. No discrete pulmonary edema. Electronically Signed   By: Francene BoyersJames  Maxwell M.D.   On: 08/15/2017 07:17      Assessment & Plan: Pt is a 83 y.o. Caucasian  female with medical problems of type 2 diabetes, insulin-dependent, depression, chronic bronchitis, peripheral vascular disease, Alzheimer's dementia, chronic kidney disease, was admitted on 08/15/2017 with shortness of breath.   1.  Acute renal failure 2.  Chronic kidney disease stage IV 3.  Urinary tract infection 4.  MRSE bacteremia 5.  Bilateral pleural effusions  Acute kidney injury likely related to concurrent illness.  Patient has underlying chronic kidney disease likely from atherosclerosis and diabetes. Shortness of breath is likely combination of pleural effusions, possibly underlying pneumonia and mild fluid overload.  Would avoid IV fluid supplementation. Continue supportive care No acute indication for dialysis at present

## 2017-08-16 NOTE — Progress Notes (Signed)
PHARMACY - PHYSICIAN COMMUNICATION CRITICAL VALUE ALERT - BLOOD CULTURE IDENTIFICATION (BCID)  Results for orders placed or performed during the hospital encounter of 08/15/17  Blood Culture ID Panel (Reflexed) (Collected: 08/15/2017  7:00 AM)  Result Value Ref Range   Enterococcus species NOT DETECTED NOT DETECTED   Listeria monocytogenes NOT DETECTED NOT DETECTED   Staphylococcus species DETECTED (A) NOT DETECTED   Staphylococcus aureus NOT DETECTED NOT DETECTED   Methicillin resistance DETECTED (A) NOT DETECTED   Streptococcus species NOT DETECTED NOT DETECTED   Streptococcus agalactiae NOT DETECTED NOT DETECTED   Streptococcus pneumoniae NOT DETECTED NOT DETECTED   Streptococcus pyogenes NOT DETECTED NOT DETECTED   Acinetobacter baumannii NOT DETECTED NOT DETECTED   Enterobacteriaceae species NOT DETECTED NOT DETECTED   Enterobacter cloacae complex NOT DETECTED NOT DETECTED   Escherichia coli NOT DETECTED NOT DETECTED   Klebsiella oxytoca NOT DETECTED NOT DETECTED   Klebsiella pneumoniae NOT DETECTED NOT DETECTED   Proteus species NOT DETECTED NOT DETECTED   Serratia marcescens NOT DETECTED NOT DETECTED   Haemophilus influenzae NOT DETECTED NOT DETECTED   Neisseria meningitidis NOT DETECTED NOT DETECTED   Pseudomonas aeruginosa NOT DETECTED NOT DETECTED   Candida albicans NOT DETECTED NOT DETECTED   Candida glabrata NOT DETECTED NOT DETECTED   Candida krusei NOT DETECTED NOT DETECTED   Candida parapsilosis NOT DETECTED NOT DETECTED   Candida tropicalis NOT DETECTED NOT DETECTED    Name of physician (or Provider) Contacted: Dr. Sung AmabileSimonds  Changes to prescribed antibiotics required: none, likely a contaminant  Cassandra Mccall, Cassandra Mccall 08/16/2017  10:35 AM

## 2017-08-16 NOTE — Progress Notes (Signed)
Off BiPAP this morning and appears reasonably comfortable on [place] O2 No new complaints  Vitals:   08/16/17 0724 08/16/17 0754 08/16/17 1025 08/16/17 1200  BP:   (!) 144/66 (!) 161/73  Pulse: 86 89 91 88  Resp: (!) 25 (!) 30  (!) 44  Temp: 99.2 F (37.3 C)   98.4 F (36.9 C)  TempSrc: Axillary   Oral  SpO2: 97% 97%  96%  Weight:      Height:         Gen: WDWN in NAD HEENT: NCAT, sclerae white Neck: No LAN, no JVD noted Lungs: Mildly diminished breath sounds BS, dullness and bronchial breath sounds in right base, no wheezes Cardiovascular: Reg rate, normal rhythm, no M noted Abdomen: Soft, NT, +BS Ext: no C/C/E Neuro: PERRL, EOMI, motor/sensory grossly intact Skin: No lesions noted  BMP Latest Ref Rng & Units 08/16/2017 08/15/2017 08/15/2017  Glucose 65 - 99 mg/dL 098(J125(H) - -  BUN 6 - 20 mg/dL 19(J57(H) - -  Creatinine 4.780.44 - 1.00 mg/dL 2.95(A2.83(H) - 2.13(Y2.52(H)  Sodium 135 - 145 mmol/L 142 - -  Potassium 3.5 - 5.1 mmol/L 3.4(L) 3.9 -  Chloride 101 - 111 mmol/L 108 - -  CO2 22 - 32 mmol/L 22 - -  Calcium 8.9 - 10.3 mg/dL 7.9(L) - -    CBC Latest Ref Rng & Units 08/16/2017 08/15/2017  WBC 3.6 - 11.0 K/uL 7.0 12.4(H)  Hemoglobin 12.0 - 16.0 g/dL 10.9(L) 12.5  Hematocrit 35.0 - 47.0 % 33.2(L) 39.5  Platelets 150 - 440 K/uL 202 237    Cardiac Panel (last 3 results) Recent Labs    08/15/17 1153 08/15/17 1651 08/16/17 0454  TROPONINI 6.25* 11.17* 7.67*   MICRO DATA: MRSA PCR 12/26 >> NEG Urine 12/26>>  Blood 12/26 >> 1/2 staph, not aureus (deemed contaminant)  ANTIMICROBIALS:  Vanc X 1 12/26 Azithromycin 12/26 >>  Ceftriaxone 12/26 >>   CXR: NSC R>L effusions  ASSESSMENT / PLAN:  PULMONARY A: Former smoker with documented history of COPD Acute hypoxemic respiratory failure R >L pleural effusions Possible pneumonia Possible component of pulmonary edema P:   Continue BiPAP as needed Continue supplemental oxygen Continue nebulized steroids and  bronchodilators Continue diuresis as permitted by BP and renal function DNI  CARDIOVASCULAR A:  History of CHF  History of peripheral vascular disease Acute coronary syndrome with elevated troponin I (peak 11) P:  Management per cardiology  RENAL A:   AKI, nonoliguric CKD - baseline creatinine approximately 2.0 Hyperkalemia, mild P:   Nephrology following Monitor BMET intermittently Monitor I/Os Correct electrolytes as indicated   GASTROINTESTINAL A:   No acute issues P:   SUP: PO famotidine (takes chronically) Advance diet as able  HEMATOLOGIC A:   No acute issues P:  DVT px: SQ heparin Monitor CBC intermittently Transfuse per usual guidelines   INFECTIOUS A:   Suspected CAP Mildly elevated PCT P:   Monitor temp, WBC count Micro and abx as above PCT protocol  ENDOCRINE A:   DM 2, controlled P:   SSI changed to ACHS  NEUROLOGIC A:   Advanced dementia P:   RASS goal: 0 Cont Memantine Minimize sedatives   FAMILY  - Updates: Son updated at bedside   Billy Fischeravid Simonds, MD PCCM service Mobile [phone] Pager [phone] 08/16/2017 2:04 PM

## 2017-08-16 NOTE — Care Management (Signed)
RNCM consult for CHF/Home health referral. RNCM spoke with patient's daughter karen by phone [phone]. Clydie BraunKaren states she is being treated pneumonia. Clydie BraunKaren is her health care power attorney. She states that patient does not make health care decisions.  Patient is now on 2L supplemental O2. She wears Nocturnal Oxygen at home through [facility] so a new qualification will be needed. [place] has [place] permanently from [place] with her daughter Clydie BraunKaren in [place]. Patient walks with a rollator at home. She also has a 3-wheeled walker". Her PCP is Dr. Einar CrowMarshall Anderson. She has  No preference of home health agencies. Clydie BraunKaren would like for PT to evaluate patient [facility].

## 2017-08-16 NOTE — Progress Notes (Signed)
Patient able to wear  until around 1500. Patient began to c/o SOB, bipap replaced. Bipap removed at 1700 to take medication and eat dinner. Patient still remains off bipap at this time. Night shift RN given report.  Trudee KusterBrandi R Mansfield

## 2017-08-17 DIAGNOSIS — J189 Pneumonia, unspecified organism: Secondary | ICD-10-CM

## 2017-08-17 DIAGNOSIS — J810 Acute pulmonary edema: Secondary | ICD-10-CM

## 2017-08-17 LAB — BASIC METABOLIC PANEL
Anion gap: 11 (ref 5–15)
BUN: 59 mg/dL — AB (ref 6–20)
CO2: 24 mmol/L (ref 22–32)
Calcium: 8.1 mg/dL — ABNORMAL LOW (ref 8.9–10.3)
Chloride: 108 mmol/L (ref 101–111)
Creatinine, Ser: 2.72 mg/dL — ABNORMAL HIGH (ref 0.44–1.00)
GFR calc Af Amer: 17 mL/min — ABNORMAL LOW (ref >60)
GFR, EST NON AFRICAN AMERICAN: 15 mL/min — AB (ref >60)
GLUCOSE: 104 mg/dL — AB (ref 65–99)
POTASSIUM: 3.4 mmol/L — AB (ref 3.5–5.1)
Sodium: 143 mmol/L (ref 135–145)

## 2017-08-17 LAB — GLUCOSE, CAPILLARY
GLUCOSE-CAPILLARY: 95 mg/dL (ref 65–99)
GLUCOSE-CAPILLARY: 198 mg/dL — AB (ref 65–99)
GLUCOSE-CAPILLARY: 114 mg/dL — AB (ref 65–99)
Glucose-Capillary: 159 mg/dL — ABNORMAL HIGH (ref 65–99)

## 2017-08-17 LAB — CULTURE, BLOOD (ROUTINE X 2)

## 2017-08-17 LAB — PROCALCITONIN: Procalcitonin: 1 ng/mL

## 2017-08-17 MED ORDER — POTASSIUM CHLORIDE CRYS ER 20 MEQ PO TBCR
40.0000 meq | EXTENDED_RELEASE_TABLET | Freq: Once | ORAL | Status: AC
  Administered 2017-08-17: 40 meq via ORAL
  Filled 2017-08-17: qty 2

## 2017-08-17 MED ORDER — METOPROLOL SUCCINATE ER 25 MG PO TB24
25.0000 mg | ORAL_TABLET | Freq: Every day | ORAL | Status: DC
  Administered 2017-08-17 – 2017-08-21 (×5): 25 mg via ORAL
  Filled 2017-08-17 (×5): qty 1

## 2017-08-17 MED ORDER — METOPROLOL SUCCINATE ER 25 MG PO TB24: 25.0000 mg | ORAL_TABLET | Freq: Every day | ORAL | Status: DC

## 2017-08-17 MED ORDER — AZITHROMYCIN 250 MG PO TABS
250.0000 mg | ORAL_TABLET | Freq: Every day | ORAL | Status: AC
  Administered 2017-08-17 – 2017-08-19 (×3): 250 mg via ORAL
  Filled 2017-08-17 (×3): qty 1

## 2017-08-17 NOTE — Progress Notes (Signed)
Patient transferred from ccu to unit. Report received from [place]andi, [place]. Pt Oriented to room, call bell, and staff. Bed in lowest position. Fall safety plan reviewed.  Telemetry box verification with tele clerk- Box#: 18. Will continue to monitor.

## 2017-08-17 NOTE — Plan of Care (Signed)
  Progressing Education: Knowledge of General Education information will improve 08/17/2017 1751 - Progressing by Rigoberto NoelMorales, Josy Peaden Y, RN Health Behavior/Discharge Planning: Ability to manage health-related needs will improve 08/17/2017 1751 - Progressing by Rigoberto NoelMorales, Damyra Luscher Y, RN Clinical Measurements: Ability to maintain clinical measurements within normal limits will improve 08/17/2017 1751 - Progressing by Rigoberto NoelMorales, Shahad Mazurek Y, RN

## 2017-08-17 NOTE — Progress Notes (Signed)
Remains off BiPAP Comfortable on [place] O2 No new complaints Strong cough  Vitals:   08/17/17 0800 08/17/17 0900 08/17/17 1058 08/17/17 1200  BP: (!) 142/55 (!) 145/51 (!) 135/56 (!) 138/56  Pulse: 91 91 87 84  Resp: (!) 21 (!) 36  (!) 28  Temp: 98.7 F (37.1 C)     TempSrc: Axillary     SpO2: 95% 96%  95%  Weight:      Height:         Gen: NAD HEENT: NCAT, sclerae white Lungs: Few scattered rhonchi, no wheezes Cardiovascular: Reg, no M noted Abdomen: Soft, NT, +BS Ext: no C/C/E Neuro: grossly intact  BMP Latest Ref Rng & Units 08/17/2017 08/16/2017 08/15/2017  Glucose 65 - 99 mg/dL 161(W104(H) 960(A125(H) -  BUN 6 - 20 mg/dL 54(U59(H) 98(J57(H) -  Creatinine 0.44 - 1.00 mg/dL 1.91(Y2.72(H) 7.82(N2.83(H) -  Sodium 135 - 145 mmol/L 143 142 -  Potassium 3.5 - 5.1 mmol/L 3.4(L) 3.4(L) 3.9  Chloride 101 - 111 mmol/L 108 108 -  CO2 22 - 32 mmol/L 24 22 -  Calcium 8.9 - 10.3 mg/dL 8.1(L) 7.9(L) -    CBC Latest Ref Rng & Units 08/16/2017 08/15/2017  WBC 3.6 - 11.0 K/uL 7.0 12.4(H)  Hemoglobin 12.0 - 16.0 g/dL 10.9(L) 12.5  Hematocrit 35.0 - 47.0 % 33.2(L) 39.5  Platelets 150 - 440 K/uL 202 237    Cardiac Panel (last 3 results) Recent Labs    08/15/17 1153 08/15/17 1651 08/16/17 0454  TROPONINI 6.25* 11.17* 7.67*   MICRO DATA: MRSA PCR 12/26 >> NEG Urine 12/26>> NEG Blood 12/26 >> 1/2 staph, not aureus (deemed contaminant)  ANTIMICROBIALS:  Vanc X 1 12/26 Azithromycin 12/26 >>  Ceftriaxone 12/26 >>   CXR: No new film  ASSESSMENT / PLAN: Former smoker with documented history of COPD Acute hypoxemic respiratory failure Small B pleural effusions Probable pneumonia Probable component of pulmonary edema History of CHF  History of peripheral vascular disease Acute coronary syndrome with elevated troponin I (peak 11) AKI, nonoliguric CKD - baseline creatinine approximately 2.0 Hyperkalemia, resolved Mild hypokalemia DM 2, controlled  Advanced dementia P:   Transfer to MedSurg floor with  cardiac monitoring Continue supplemental oxygen Continue nebulized steroids and bronchodilators Continue diuresis as permitted by BP and renal function Management of cardiac problems per cardiology Nephrology following Monitor BMET intermittently Monitor I/Os Correct electrolytes as indicated  SUP: PO famotidine (takes chronically) DVT px: SQ heparin Monitor CBC intermittently Transfuse per usual guidelines  Monitor temp, WBC count Micro and abx as above PCT protocol Continue SSI Cont Memantine Minimize sedatives  After transfer, PCCM will sign off. Please call if we can be of further assistance   FAMILY  - Updates: Son updated at bedside   Billy Fischeravid Simonds, MD PCCM service Mobile [phone] Pager [phone] 08/17/2017 12:55 PM

## 2017-08-17 NOTE — Progress Notes (Signed)
[facility] [place] [facility]   PATIENT NAME: Cassandra DanielMargie Mccall    MR#:  [id number]  DATE OF BIRTH:  08-18-1929  SUBJECTIVE:  CHIEF COMPLAINT:   Chief Complaint  Patient presents with  . Shortness of Breath   Patient is on oxygen by nasal cannula 2 L, she was on BiPAP last night and off BiPAP morning. REVIEW OF SYSTEMS:  Review of Systems  Unable to perform ROS: Dementia    DRUG ALLERGIES:   Allergies  Allergen Reactions  . Sulfa Antibiotics    VITALS:  Blood pressure (!) 138/56, pulse 84, temperature 98.3 F (36.8 C), temperature source Oral, resp. rate (!) 28, height 5\' 7"  (1.702 m), weight 153 lb 3.5 oz (69.5 kg), SpO2 95 %. PHYSICAL EXAMINATION:  Physical Exam  Constitutional: She is well-developed, well-nourished, and in no distress.  HENT:  Head: Normocephalic.  Mouth/Throat: Oropharynx is clear and moist.  Eyes: Conjunctivae and EOM are normal. Pupils are equal, round, and reactive to light. No scleral icterus.  Neck: Normal range of motion. Neck supple. No JVD present. No tracheal deviation present.  Cardiovascular: Normal rate, regular rhythm and normal heart sounds. Exam reveals no gallop.  No murmur heard. Pulmonary/Chest: Effort normal. No respiratory distress. She has no wheezes. She has rales.  Abdominal: Soft. Bowel sounds are normal. She exhibits no distension. There is no tenderness. There is no rebound.  Musculoskeletal: She exhibits edema. She exhibits no tenderness.  Neurological: No cranial nerve deficit.  AAOx1  Skin: No rash noted. No erythema.  Psychiatric:  The patient is awake but demented.   LABORATORY PANEL:  Female CBC Recent Labs  Lab 08/16/17 0454  WBC 7.0  HGB 10.9*  HCT 33.2*  PLT 202   ------------------------------------------------------------------------------------------------------------------ Chemistries  Recent Labs  Lab 08/15/17 0700  08/17/17 0622  NA 137   < > 143  K 5.6*   < > 3.4*  CL  107   < > 108  CO2 21*   < > 24  GLUCOSE 234*   < > 104*  BUN 53*   < > 59*  CREATININE 2.48*   < > 2.72*  CALCIUM 8.9   < > 8.1*  AST 130*  --   --   ALT 102*  --   --   ALKPHOS 74  --   --   BILITOT 0.8  --   --    < > = values in this interval not displayed.   RADIOLOGY:  No results found. ASSESSMENT AND PLAN:   Acute on chronic respiratory failure hypoxia due to acute on chronic CHF with pleural effusion and possible PNA. Off BiPAP, continue oxygen by nasal cannula, NEB PRN.  Acute on chronic diastolic CHF with pleural effusion. Echo: Systolic function was normal. The estimated ejection   fraction was in the range of 50% to 55%. Continue Lasix IV 40 mg twice daily.  Possible pneumonia.  Continue Zithromax and Rocephin, follow-up cultures.  Robitussin as needed  UTI.  Continue antibiotics.  Elevated troponin.  Possible due to demanding ischemia, continue aspirin and Plavix, changed to high intensity statin. defer invasive coronary evaluation for now given renal insuffiency. This will need to be readdressed when renal function improves per Dr. Lady GaryFath.  Acute on chronic renal failure stage IV.  Follow-up BMP while on Lasix, follow BMP and no indication for dialysis per Dr. Thedore MinsSingh.  Hyperkalemia.    Improved with Kayexalate.  Lactic acidosis.  Due to above, treatment as  above.  Improved.  Leukocytosis.    Improved.  Abnormal liver function tests.  Unclear etiology, follow-up CMP.  COPD.  Stable continue home nebulizer.  PAD.  Continue aspirin and Plavix.  Diabetes.  Sliding scale.  Alzheimer's disease.  Aspiration in the fall precaution.  Discussed with Dr. Thedore MinsSingh. All the records are reviewed and case discussed with Care Management/Social Worker. Management plans discussed with the patient, and they are in agreement.  CODE STATUS: DNR  TOTAL TIME TAKING CARE OF THIS PATIENT: 36 minutes.   More than 50% of the time was spent in counseling/coordination of  care: YES  POSSIBLE D/C IN 2-3 DAYS, DEPENDING ON CLINICAL CONDITION.   Shaune PollackQing Akyra Bouchie M.D on 08/17/2017 at 2:11 PM  Between 7am to 6pm - Pager - [phone]  After 6pm go to [email or web address] - Therapist, nutritionalpassword EPAS [facility]  [facility] [place] [facility]

## 2017-08-17 NOTE — Progress Notes (Signed)
PHARMACIST - PHYSICIAN COMMUNICATION DR:   Sung AmabileSimonds CONCERNING: Antibiotic IV to Oral Route Change Policy  RECOMMENDATION: This patient is receiving azithromycin by the intravenous route.  Based on criteria approved by the Pharmacy and Therapeutics Committee, the antibiotic(s) is/are being converted to the equivalent oral dose form(s).   DESCRIPTION: These criteria include:  Patient being treated for a respiratory tract infection, urinary tract infection, cellulitis or clostridium difficile associated diarrhea if on metronidazole  The patient is not neutropenic and does not exhibit a GI malabsorption state  The patient is eating (either orally or via tube) and/or has been taking other orally administered medications for a least 24 hours  The patient is improving clinically and has a Tmax < 100.5  If you have questions about this conversion, please contact the Pharmacy Department  []   [phone]( [phone] )  [facility] [x]   [phone]( [phone] )  [facility] []   [phone]( [phone] )  [facility] []   [phone]( [phone] )  [facility] []   [phone]( [phone] )  [facility]  Luisa HartScott Faustino Luecke, PharmD Clinical Pharmacist

## 2017-08-17 NOTE — Progress Notes (Signed)
Breathing improved somewhat yesterday. Troponin has peaked and is coming down. Renal funciton remains decreased. Will need to continue to defer invasive coronary evaluation for now given renal insuffiency. This will need to be readdressed when renal function improves. Dr. Welton FlakesKhan is covering our practice this weekend.

## 2017-08-17 NOTE — Progress Notes (Signed)
[facility], [place] 08/17/17  Subjective:   No acute events.  Breathing appears to be slightly better although she still has a wet cough Urine output is 1600 over the last 24 hours There is slight improvement in serum creatinine to 2.72  Objective:  Vital signs in last 24 hours:  Temp:  [97.4 F (36.3 C)-98.7 F (37.1 C)] 98.3 F (36.8 C) (12/28 1200) Pulse Rate:  [79-104] 84 (12/28 1200) Resp:  [21-39] 28 (12/28 1200) BP: (114-155)/(51-95) 138/56 (12/28 1200) SpO2:  [92 %-100 %] 95 % (12/28 1200) FiO2 (%):  [30 %] 30 % (12/28 0159) Weight:  [69.5 kg (153 lb 3.5 oz)] 69.5 kg (153 lb 3.5 oz) (12/28 0500)  Weight change: -1.2 kg (-10.3 oz) Filed Weights   08/15/17 0643 08/15/17 1116 08/17/17 0500  Weight: 72.7 kg (160 lb 4.4 oz) 70.7 kg (155 lb 13.8 oz) 69.5 kg (153 lb 3.5 oz)    Intake/Output:    Intake/Output Summary (Last 24 hours) at 08/17/2017 1529 Last data filed at 08/17/2017 1400 Gross per 24 hour  Intake 50 ml  Output 1095 ml  Net -1045 ml     Physical Exam: General:  Lying in the bed, no acute distress  HEENT  anicteric, moist oral mucous membranes  Neck  supple  Pulm/lungs  oxygen supplementation with nasal cannula, bilateral basilar crackles and mild diffuse rhonchi  CVS/Heart  tachycardic  Abdomen:   Soft, nontender  Extremities:  Trace to 1+ edema  Neurologic:  Alert, able to answer questions  Skin:  No acute rashes          Basic Metabolic Panel:  Recent Labs  Lab 08/15/17 0700 08/15/17 1153 08/15/17 1932 08/16/17 0454 08/17/17 0622  NA 137  --   --  142 143  K 5.6*  --  3.9 3.4* 3.4*  CL 107  --   --  108 108  CO2 21*  --   --  22 24  GLUCOSE 234*  --   --  125* 104*  BUN 53*  --   --  57* 59*  CREATININE 2.48* 2.52*  --  2.83* 2.72*  CALCIUM 8.9  --   --  7.9* 8.1*     CBC: Recent Labs  Lab 08/15/17 0700 08/16/17 0454  WBC 12.4* 7.0  NEUTROABS 11.4*  --   HGB 12.5 10.9*  HCT 39.5 33.2*  MCV  95.1 93.5  PLT 237 202     No results found for: HEPBSAG, HEPBSAB, HEPBIGM    Microbiology:  Recent Results (from the past 240 hour(s))  Blood Culture (routine x 2)     Status: Abnormal   Collection Time: 08/15/17  7:00 AM  Result Value Ref Range Status   Specimen Description   Final    BLOOD LEFT FINGER Performed at [facility], [address], [place], [place] [address]    Special Requests   Final    BOTTLES DRAWN AEROBIC AND ANAEROBIC BCAV Performed at [facility], [address] [place], [place] [address]    Culture  Setup Time   Final    GRAM POSITIVE COCCI IN BOTH AEROBIC AND ANAEROBIC BOTTLES CRITICAL RESULT CALLED TO, READ BACK BY AND VERIFIED WITH: SHEEMA HALLAJI ON 08/16/17 AT 0823 QSD    Culture (A)  Final    STAPHYLOCOCCUS SPECIES (COAGULASE NEGATIVE) THE SIGNIFICANCE OF ISOLATING THIS ORGANISM FROM A SINGLE VENIPUNCTURE CANNOT BE PREDICTED WITHOUT FURTHER CLINICAL AND CULTURE CORRELATION. SUSCEPTIBILITIES AVAILABLE ONLY ON  REQUEST. Performed at [facility], [address] [place], [place] [address]    Report Status 08/17/2017 FINAL  Final  Blood Culture ID Panel (Reflexed)     Status: Abnormal   Collection Time: 08/15/17  7:00 AM  Result Value Ref Range Status   Enterococcus species NOT DETECTED NOT DETECTED Final   Listeria monocytogenes NOT DETECTED NOT DETECTED Final   Staphylococcus species DETECTED (A) NOT DETECTED Final    Comment: Methicillin (oxacillin) resistant coagulase negative staphylococcus. Possible blood culture contaminant (unless isolated from more than one blood culture draw or clinical case suggests pathogenicity). No antibiotic treatment is indicated for blood  culture contaminants. CRITICAL RESULT CALLED TO, READ BACK BY AND VERIFIED WITH: SHEEMA HALLAJI ON 08/16/17 AT [id number] QSD    Staphylococcus aureus NOT DETECTED NOT DETECTED Final   Methicillin resistance DETECTED (A) NOT DETECTED Final    Comment:  CRITICAL RESULT CALLED TO, READ BACK BY AND VERIFIED WITH: SHEEMA HALLAJI ON 08/16/17 AT 0823 QSD    Streptococcus species NOT DETECTED NOT DETECTED Final   Streptococcus agalactiae NOT DETECTED NOT DETECTED Final   Streptococcus pneumoniae NOT DETECTED NOT DETECTED Final   Streptococcus pyogenes NOT DETECTED NOT DETECTED Final   Acinetobacter baumannii NOT DETECTED NOT DETECTED Final   Enterobacteriaceae species NOT DETECTED NOT DETECTED Final   Enterobacter cloacae complex NOT DETECTED NOT DETECTED Final   Escherichia coli NOT DETECTED NOT DETECTED Final   Klebsiella oxytoca NOT DETECTED NOT DETECTED Final   Klebsiella pneumoniae NOT DETECTED NOT DETECTED Final   Proteus species NOT DETECTED NOT DETECTED Final   Serratia marcescens NOT DETECTED NOT DETECTED Final   Haemophilus influenzae NOT DETECTED NOT DETECTED Final   Neisseria meningitidis NOT DETECTED NOT DETECTED Final   Pseudomonas aeruginosa NOT DETECTED NOT DETECTED Final   Candida albicans NOT DETECTED NOT DETECTED Final   Candida glabrata NOT DETECTED NOT DETECTED Final   Candida krusei NOT DETECTED NOT DETECTED Final   Candida parapsilosis NOT DETECTED NOT DETECTED Final   Candida tropicalis NOT DETECTED NOT DETECTED Final    Comment: Performed at [facility], [address] [place], [place] [address]  MRSA PCR Screening     Status: None   Collection Time: 08/15/17 10:51 AM  Result Value Ref Range Status   MRSA by PCR NEGATIVE NEGATIVE Final    Comment:        The GeneXpert MRSA Assay (FDA approved for NASAL specimens only), is one component of a comprehensive MRSA colonization surveillance program. It is not intended to diagnose MRSA infection nor to guide or monitor treatment for MRSA infections. Performed at [facility], [address] [place], [place] [address]   Urine culture     Status: None   Collection Time: 08/15/17  1:31 PM  Result Value Ref Range Status   Specimen Description    Final    URINE, RANDOM Performed at [facility], [address] [place], [place] [address]    Special Requests   Final    NONE Performed at [facility], [address] [place], [place] [address]    Culture   Final    NO GROWTH Performed at [facility], [address] [place], [place] [address]    Report Status 08/16/2017 FINAL  Final    Coagulation Studies: No results for input(s): LABPROT, INR in the last 72 hours.  Urinalysis: Recent Labs    08/15/17 1331  COLORURINE YELLOW*  LABSPEC 1.012  PHURINE 5.0  GLUCOSEU 150*  HGBUR SMALL*  BILIRUBINUR NEGATIVE  KETONESUR NEGATIVE  PROTEINUR 100*  NITRITE NEGATIVE  LEUKOCYTESUR LARGE*      Imaging: Dg Chest Port 1 View  Result Date: 08/16/2017 CLINICAL DATA:  Respiratory failure EXAM: PORTABLE CHEST 1 VIEW COMPARISON:  08/15/2017 FINDINGS: Cardiac shadow is stable. Aortic calcifications are again seen. Bilateral pleural effusions are again noted and stable. Likely underlying infiltrate/atelectasis is present. No acute bony abnormality is seen. IMPRESSION: Stable appearance of the chest with bilateral pleural effusions Electronically Signed   By: Alcide CleverMark  Lukens M.D.   On: 08/16/2017 07:45     Medications:   . sodium chloride    . cefTRIAXone (ROCEPHIN)  IV Stopped (08/17/17 1257)   . ALPRAZolam  0.125-0.25 mg Oral QHS  . aspirin EC  81 mg Oral Daily  . atorvastatin  80 mg Oral q1800  . azithromycin  250 mg Oral Daily  . chlorhexidine  15 mL Mouth Rinse BID  . clopidogrel  75 mg Oral Daily  . famotidine  10 mg Oral Daily  . heparin  5,000 Units Subcutaneous Q8H  . insulin aspart  0-15 Units Subcutaneous TID WC  . insulin aspart  0-5 Units Subcutaneous QHS  . ipratropium-albuterol  3 mL Nebulization Q6H  . magnesium oxide  400 mg Oral Daily  . mouth rinse  15 mL Mouth Rinse q12n4p  . memantine  10 mg Oral BID  . metoprolol succinate  25 mg Oral Daily  . sodium chloride flush  3 mL  Intravenous Q12H   sodium chloride, acetaminophen **OR** [DISCONTINUED] acetaminophen, albuterol, bisacodyl, guaiFENesin-dextromethorphan, ondansetron **OR** ondansetron (ZOFRAN) IV, senna-docusate, sodium chloride flush  Assessment/ Plan:  83 y.o.cauc[place] female with medical problems of type 2 diabetes, insulin-dependent, depression, chronic bronchitis, peripheral vascular disease, Alzheimer's dementia, chronic kidney disease, was admitted on 08/15/2017 with shortness of breath.   1.  Acute renal failure 2.  Chronic kidney disease stage IV 3.  Urinary tract infection 4.  MRSE(coag neg staph) bacteremia 5.  Bilateral pleural effusions  Acute kidney injury likely related to concurrent illness.  Patient has underlying chronic kidney disease likely from atherosclerosis and diabetes. Shortness of breath is likely combination of pleural effusions, possibly underlying pneumonia and mild fluid overload.  Would avoid IV fluid supplementation. Serum creatinine has improved slightly.  Urine output is satisfactory. Continue supportive care No acute indication for dialysis at present     LOS: 2 [facility] 12/28/20183:29 PM  [facility] [place], [place] [phone]

## 2017-08-17 NOTE — Progress Notes (Signed)
Report given to [place], RN on 2A. Patient placed on telemetry. CCMD notified. Family updated on plan of care. Trudee KusterBrandi R [place]

## 2017-08-18 LAB — BASIC METABOLIC PANEL
ANION GAP: 8 (ref 5–15)
BUN: 63 mg/dL — ABNORMAL HIGH (ref 6–20)
CALCIUM: 8.3 mg/dL — AB (ref 8.9–10.3)
CO2: 24 mmol/L (ref 22–32)
Chloride: 108 mmol/L (ref 101–111)
Creatinine, Ser: 2.52 mg/dL — ABNORMAL HIGH (ref 0.44–1.00)
GFR, EST AFRICAN AMERICAN: 19 mL/min — AB (ref >60)
GFR, EST NON AFRICAN AMERICAN: 16 mL/min — AB (ref >60)
GLUCOSE: 107 mg/dL — AB (ref 65–99)
POTASSIUM: 4.9 mmol/L (ref 3.5–5.1)
SODIUM: 140 mmol/L (ref 135–145)

## 2017-08-18 LAB — GLUCOSE, CAPILLARY
GLUCOSE-CAPILLARY: 111 mg/dL — AB (ref 65–99)
GLUCOSE-CAPILLARY: 144 mg/dL — AB (ref 65–99)
GLUCOSE-CAPILLARY: 111 mg/dL — AB (ref 65–99)
Glucose-Capillary: 172 mg/dL — ABNORMAL HIGH (ref 65–99)

## 2017-08-18 MED ORDER — IPRATROPIUM-ALBUTEROL 0.5-2.5 (3) MG/3ML IN SOLN
3.0000 mL | Freq: Three times a day (TID) | RESPIRATORY_TRACT | Status: DC
Start: 2017-08-18 — End: 2017-08-21
  Administered 2017-08-18 – 2017-08-21 (×10): 3 mL via RESPIRATORY_TRACT
  Filled 2017-08-18 (×11): qty 3

## 2017-08-18 MED ORDER — CEFUROXIME AXETIL 500 MG PO TABS
500.0000 mg | ORAL_TABLET | Freq: Two times a day (BID) | ORAL | Status: DC
  Administered 2017-08-18 – 2017-08-19 (×3): 500 mg via ORAL
  Filled 2017-08-18 (×3): qty 1

## 2017-08-18 NOTE — Evaluation (Signed)
Physical Therapy Evaluation Patient Details Name: Cassandra DanielMargie Mccall MRN: [id number] DOB: 08-04-1929 Today's Date: 08/18/2017   History of Present Illness  83 yo female with pleural effusion, elevated troponin from demand, CHF, PNA and UTI was admitted.  Her PMHx:  CHF, PAD, chronic resp failure on O2 at night, COPD, DM, and abnormal liver function   Clinical Impression  Pt was seen for evaluation of her mobility and noted that SOB is main limitation along with low endurance from generalized weakness.  Her daughter who is caregiver is present to discuss PLOF and note that she is much declined from previous independence with gait.  Will continue acute therapy to progress toward more I with balance and increase LE strength to increase safety with her mobility.      Follow Up Recommendations SNF    Equipment Recommendations  None recommended by PT    Recommendations for Other Services       Precautions / Restrictions Precautions Precautions: Fall(telemetry) Precaution Comments: ck sats with effort Restrictions Weight Bearing Restrictions: No      Mobility  Bed Mobility Overal bed mobility: Needs Assistance Bed Mobility: Supine to Sit;Sit to Supine     Supine to sit: Mod assist Sit to supine: Min assist   General bed mobility comments: mainly trunk assist to get out of bed and legs to return to bed  Transfers Overall transfer level: Needs assistance Equipment used: Rolling walker (2 wheeled);1 person hand held assist Transfers: Sit to/from Stand Sit to Stand: Min assist;Mod assist         General transfer comment: cues for hand placement and then to power up  Ambulation/Gait Ambulation/Gait assistance: Min assist Ambulation Distance (Feet): 3 Feet Assistive device: Rolling walker (2 wheeled);1 person hand held assist Gait Pattern/deviations: Step-to pattern;Decreased stride length;Shuffle;Wide base of support;Trunk flexed Gait velocity: reduced Gait velocity  interpretation: Below normal speed for age/gender General Gait Details: sidestep bedside with effort and very SOB with O2 sats  holding at 2L O2  Stairs            Wheelchair Mobility    Modified Rankin (Stroke Patients Only)       Balance Overall balance assessment: Needs assistance Sitting-balance support: Feet supported;Bilateral upper extremity supported Sitting balance-Leahy Scale: Fair     Standing balance support: Bilateral upper extremity supported;During functional activity Standing balance-Leahy Scale: Poor Standing balance comment: very weak and dependent on the RW                             Pertinent Vitals/Pain Pain Assessment: No/denies pain    Home Living Family/patient expects to be discharged to:: Private residence Living Arrangements: Children Available Help at Discharge: Family;Available 24 hours/day Type of Home: House Home Access: Stairs to enter   Entergy CorporationEntrance Stairs-Number of Steps: 4 Home Layout: One level Home Equipment: Walker - 4 wheels;Shower seat      Prior Function Level of Independence: Needs assistance   Gait / Transfers Assistance Needed: Mod I with rollator in the house  ADL's / Homemaking Assistance Needed: daughter assists with her care as needed but could toilet herself        Hand Dominance   Dominant Hand: Right    Extremity/Trunk Assessment   Upper Extremity Assessment Upper Extremity Assessment: Generalized weakness    Lower Extremity Assessment Lower Extremity Assessment: Generalized weakness    Cervical / Trunk Assessment Cervical / Trunk Assessment: Kyphotic  Communication   Communication: No difficulties  Cognition Arousal/Alertness: Awake/alert Behavior During Therapy: Flat affect Overall Cognitive Status: History of cognitive impairments - at baseline                                 General Comments: pt is very much confused but daughter present for evaluation       General Comments      Exercises     Assessment/Plan    PT Assessment Patient needs continued PT services  PT Problem List Decreased strength;Decreased range of motion;Decreased activity tolerance;Decreased balance;Decreased mobility;Decreased coordination;Decreased cognition;Decreased knowledge of use of DME;Decreased safety awareness;Cardiopulmonary status limiting activity       PT Treatment Interventions DME instruction;Gait training;Stair training;Functional mobility training;Therapeutic activities;Therapeutic exercise;Balance training;Neuromuscular re-education;Patient/family education    PT Goals (Current goals can be found in the Care Plan section)  Acute Rehab PT Goals Patient Stated Goal: none stated PT Goal Formulation: With family Time For Goal Achievement: 09/01/17 Potential to Achieve Goals: Good    Frequency Min 2X/week   Barriers to discharge Inaccessible home environment;Decreased caregiver support daughter cannot physically assist her and has stairs to enter house    Co-evaluation               AM-PAC PT "6 Clicks" Daily Activity  Outcome Measure Difficulty turning over in bed (including adjusting bedclothes, sheets and blankets)?: Unable Difficulty moving from lying on back to sitting on the side of the bed? : Unable Difficulty sitting down on and standing up from a chair with arms (e.g., wheelchair, bedside commode, etc,.)?: Unable Help needed moving to and from a bed to chair (including a wheelchair)?: A Lot Help needed walking in hospital room?: A Lot Help needed climbing 3-5 steps with a railing? : Total 6 Click Score: 8    End of Session Equipment Utilized During Treatment: Gait belt;Oxygen Activity Tolerance: Patient limited by fatigue;Treatment limited secondary to medical complications (Comment)(SOB with all effort but stable pulses and O2 sats) Patient left: in bed;with call bell/phone within reach;with bed alarm set;with family/visitor  present Nurse Communication: Mobility status PT Visit Diagnosis: Unsteadiness on feet (R26.81);Muscle weakness (generalized) (M62.81);Difficulty in walking, not elsewhere classified (R26.2);Other (comment)(light headed)    Time: 1129-1207 PT Time Calculation (min) (ACUTE ONLY): 38 min   Charges:   PT Evaluation $PT Eval Moderate Complexity: 1 Mod PT Treatments $Therapeutic Activity: 23-37 mins   PT G Codes:   PT G-Codes **NOT FOR INPATIENT CLASS** Functional Assessment Tool Used: AM-PAC 6 Clicks Basic Mobility   Ivar DrapeRuth E Tahji  08/18/2017, 12:41 PM   Samul Dadauth Vandy Tsuchiya, PT MS Acute Rehab Dept. Number: [facility] [phone] and [facility] [phone]

## 2017-08-18 NOTE — Progress Notes (Signed)
[facility] [place], [place] 08/18/17  Subjective:   No acute events.  Breathing appears to be slightly better although she still has a wet cough Urine output is fair.  Serum creatinine slightly further improved to 2.52 Patient's daughter is at bedside   Objective:  Vital signs in last 24 hours:  Temp:  [98 F (36.7 C)-98.8 F (37.1 C)] 98.2 F (36.8 C) (12/29 0820) Pulse Rate:  [75-99] 75 (12/29 0820) Resp:  [16-38] 16 (12/29 0820) BP: (132-150)/(35-71) 135/47 (12/29 0820) SpO2:  [95 %-99 %] 97 % (12/29 0820) Weight:  [68.5 kg (151 lb)] 68.5 kg (151 lb) (12/29 0431)  Weight change: -1.007 kg (-3.5 oz) Filed Weights   08/15/17 1116 08/17/17 0500 08/18/17 0431  Weight: 70.7 kg (155 lb 13.8 oz) 69.5 kg (153 lb 3.5 oz) 68.5 kg (151 lb)    Intake/Output:    Intake/Output Summary (Last 24 hours) at 08/18/2017 1313 Last data filed at 08/18/2017 1304 Gross per 24 hour  Intake 240 ml  Output 745 ml  Net -505 ml     Physical Exam: General:  Lying in the bed, no acute distress  HEENT  anicteric, moist oral mucous membranes  Neck  supple  Pulm/lungs  oxygen supplementation with nasal cannula, mild diffuse rhonchi and wheezing.  No crackles today  CVS/Heart  tachycardic  Abdomen:   Soft, nontender  Extremities:  Trace to 1+ edema  Neurologic:  Alert, able to answer questions  Skin:  No acute rashes          Basic Metabolic Panel:  Recent Labs  Lab 08/15/17 0700 08/15/17 1153 08/15/17 1932 08/16/17 0454 08/17/17 0622 08/18/17 0525  NA 137  --   --  142 143 140  K 5.6*  --  3.9 3.4* 3.4* 4.9  CL 107  --   --  108 108 108  CO2 21*  --   --  22 24 24   GLUCOSE 234*  --   --  125* 104* 107*  BUN 53*  --   --  57* 59* 63*  CREATININE 2.48* 2.52*  --  2.83* 2.72* 2.52*  CALCIUM 8.9  --   --  7.9* 8.1* 8.3*     CBC: Recent Labs  Lab 08/15/17 0700 08/16/17 0454  WBC 12.4* 7.0  NEUTROABS 11.4*  --   HGB 12.5 10.9*  HCT 39.5 33.2*  MCV  95.1 93.5  PLT 237 202     No results found for: HEPBSAG, HEPBSAB, HEPBIGM    Microbiology:  Recent Results (from the past 240 hour(s))  Blood Culture (routine x 2)     Status: Abnormal   Collection Time: 08/15/17  7:00 AM  Result Value Ref Range Status   Specimen Description   Final    BLOOD LEFT FINGER Performed at [facility], [address] [place], [place] [address]    Special Requests   Final    BOTTLES DRAWN AEROBIC AND ANAEROBIC BCAV Performed at [facility], [address] [place], [place] [address]    Culture  Setup Time   Final    GRAM POSITIVE COCCI IN BOTH AEROBIC AND ANAEROBIC BOTTLES CRITICAL RESULT CALLED TO, READ BACK BY AND VERIFIED WITH: SHEEMA HALLAJI ON 08/16/17 AT 0823 QSD    Culture (A)  Final    STAPHYLOCOCCUS SPECIES (COAGULASE NEGATIVE) THE SIGNIFICANCE OF ISOLATING THIS ORGANISM FROM A SINGLE VENIPUNCTURE CANNOT BE PREDICTED WITHOUT FURTHER CLINICAL AND CULTURE CORRELATION. SUSCEPTIBILITIES AVAILABLE ONLY ON REQUEST. Performed at  [facility], [address], [place], [place] [address]    Report Status 08/17/2017 FINAL  Final  Blood Culture ID Panel (Reflexed)     Status: Abnormal   Collection Time: 08/15/17  7:00 AM  Result Value Ref Range Status   Enterococcus species NOT DETECTED NOT DETECTED Final   Listeria monocytogenes NOT DETECTED NOT DETECTED Final   Staphylococcus species DETECTED (A) NOT DETECTED Final    Comment: Methicillin (oxacillin) resistant coagulase negative staphylococcus. Possible blood culture contaminant (unless isolated from more than one blood culture draw or clinical case suggests pathogenicity). No antibiotic treatment is indicated for blood  culture contaminants. CRITICAL RESULT CALLED TO, READ BACK BY AND VERIFIED WITH: SHEEMA HALLAJI ON 08/16/17 AT 6045 QSD    Staphylococcus aureus NOT DETECTED NOT DETECTED Final   Methicillin resistance DETECTED (A) NOT DETECTED Final    Comment:  CRITICAL RESULT CALLED TO, READ BACK BY AND VERIFIED WITH: SHEEMA HALLAJI ON 08/16/17 AT 0823 QSD    Streptococcus species NOT DETECTED NOT DETECTED Final   Streptococcus agalactiae NOT DETECTED NOT DETECTED Final   Streptococcus pneumoniae NOT DETECTED NOT DETECTED Final   Streptococcus pyogenes NOT DETECTED NOT DETECTED Final   Acinetobacter baumannii NOT DETECTED NOT DETECTED Final   Enterobacteriaceae species NOT DETECTED NOT DETECTED Final   Enterobacter cloacae complex NOT DETECTED NOT DETECTED Final   Escherichia coli NOT DETECTED NOT DETECTED Final   Klebsiella oxytoca NOT DETECTED NOT DETECTED Final   Klebsiella pneumoniae NOT DETECTED NOT DETECTED Final   Proteus species NOT DETECTED NOT DETECTED Final   Serratia marcescens NOT DETECTED NOT DETECTED Final   Haemophilus influenzae NOT DETECTED NOT DETECTED Final   Neisseria meningitidis NOT DETECTED NOT DETECTED Final   Pseudomonas aeruginosa NOT DETECTED NOT DETECTED Final   Candida albicans NOT DETECTED NOT DETECTED Final   Candida glabrata NOT DETECTED NOT DETECTED Final   Candida krusei NOT DETECTED NOT DETECTED Final   Candida parapsilosis NOT DETECTED NOT DETECTED Final   Candida tropicalis NOT DETECTED NOT DETECTED Final    Comment: Performed at [facility], [address] [place], [place] [address]  MRSA PCR Screening     Status: None   Collection Time: 08/15/17 10:51 AM  Result Value Ref Range Status   MRSA by PCR NEGATIVE NEGATIVE Final    Comment:        The GeneXpert MRSA Assay (FDA approved for NASAL specimens only), is one component of a comprehensive MRSA colonization surveillance program. It is not intended to diagnose MRSA infection nor to guide or monitor treatment for MRSA infections. Performed at [facility], [address] [place], [place] [address]   Urine culture     Status: None   Collection Time: 08/15/17  1:31 PM  Result Value Ref Range Status   Specimen Description    Final    URINE, RANDOM Performed at [facility], [address] [place], [place] [address]    Special Requests   Final    NONE Performed at [facility], [address] [place], [place] [address]    Culture   Final    NO GROWTH Performed at [facility], [address] [place], [place] [address]    Report Status 08/16/2017 FINAL  Final    Coagulation Studies: No results for input(s): LABPROT, INR in the last 72 hours.  Urinalysis: Recent Labs    08/15/17 1331  COLORURINE YELLOW*  LABSPEC 1.012  PHURINE 5.0  GLUCOSEU 150*  HGBUR SMALL*  BILIRUBINUR NEGATIVE  KETONESUR NEGATIVE  PROTEINUR 100*  NITRITE NEGATIVE  LEUKOCYTESUR LARGE*      Imaging: No results found.   Medications:   . sodium chloride     . ALPRAZolam  0.125-0.25 mg Oral QHS  . aspirin EC  81 mg Oral Daily  . atorvastatin  80 mg Oral q1800  . azithromycin  250 mg Oral Daily  . cefUROXime  500 mg Oral BID WC  . chlorhexidine  15 mL Mouth Rinse BID  . clopidogrel  75 mg Oral Daily  . famotidine  10 mg Oral Daily  . heparin  5,000 Units Subcutaneous Q8H  . insulin aspart  0-15 Units Subcutaneous TID WC  . insulin aspart  0-5 Units Subcutaneous QHS  . ipratropium-albuterol  3 mL Nebulization TID  . magnesium oxide  400 mg Oral Daily  . mouth rinse  15 mL Mouth Rinse q12n4p  . memantine  10 mg Oral BID  . metoprolol succinate  25 mg Oral Daily  . sodium chloride flush  3 mL Intravenous Q12H   sodium chloride, acetaminophen **OR** [DISCONTINUED] acetaminophen, albuterol, bisacodyl, guaiFENesin-dextromethorphan, ondansetron **OR** ondansetron (ZOFRAN) IV, senna-docusate, sodium chloride flush  Assessment/ Plan:  83 y.o.caucsian female with medical problems of type 2 diabetes, insulin-dependent, depression, chronic bronchitis, peripheral vascular disease, Alzheimer's dementia, chronic kidney disease, was admitted on 08/15/2017 with shortness of breath.   1.  Acute  renal failure 2.  Chronic kidney disease stage IV 3.  Urinary tract infection 4.  MRSE(coag neg staph) bacteremia 5.  Bilateral pleural effusions  Acute kidney injury likely related to concurrent illness.  Patient has underlying chronic kidney disease likely from atherosclerosis and diabetes. Shortness of breath is likely combination of pleural effusions, possibly underlying pneumonia and mild fluid overload.  Would avoid IV fluid supplementation. Serum creatinine has improved slightly.  Urine output is satisfactory. Continue supportive care No acute indication for dialysis at present Discussed with patient's daughter that if she would like, patient can be evaluated as outpatient for dialysis once she is discharged and back to her baseline health.  Further evaluation for dialysis will be done based on patient's recovery.     LOS: 3 Erique Kaser Thedore MinsSingh 12/29/20181:13 PM  [facility] [place], [place] [phone]

## 2017-08-18 NOTE — Progress Notes (Signed)
[facility] [place] [facility]   PATIENT NAME: Cassandra DanielMargie Mccall    MR#:  [id number]  DATE OF BIRTH:  [birth year]  SUBJECTIVE:  Feels better Weak Son and daughter in the room Baseline function is bed--bathroom--kitchen--bed per dter , uses rollator at home, no falls recently  REVIEW OF SYSTEMS:   Review of Systems  Constitutional: Negative for chills, fever and weight loss.  HENT: Negative for ear discharge, ear pain and nosebleeds.   Eyes: Negative for blurred vision, pain and discharge.  Respiratory: Positive for cough. Negative for sputum production, shortness of breath, wheezing and stridor.   Cardiovascular: Negative for chest pain, palpitations, orthopnea and PND.  Gastrointestinal: Negative for abdominal pain, diarrhea, nausea and vomiting.  Genitourinary: Negative for frequency and urgency.  Musculoskeletal: Negative for back pain and joint pain.  Neurological: Positive for weakness. Negative for sensory change, speech change and focal weakness.  Psychiatric/Behavioral: Negative for depression and hallucinations. The patient is not nervous/anxious.    Tolerating Diet:yes Tolerating PT: pending  DRUG ALLERGIES:   Allergies  Allergen Reactions  . Sulfa Antibiotics     VITALS:  Blood pressure (!) 132/35, pulse 81, temperature 98.4 F (36.9 C), temperature source Oral, resp. rate 18, height 5\' 7"  (1.702 m), weight 68.5 kg (151 lb), SpO2 97 %.  PHYSICAL EXAMINATION:   Physical Exam  GENERAL:  83 y.o.-year-old patient lying in the bed with no acute distress.  EYES: Pupils equal, round, reactive to light and accommodation. No scleral icterus. Extraocular muscles intact.  HEENT: Head atraumatic, normocephalic. Oropharynx and nasopharynx clear.  NECK:  Supple, no jugular venous distention. No thyroid enlargement, no tenderness.  LUNGS: Normal breath sounds bilaterally, no wheezing, rales, rhonchi. No use of accessory muscles of respiration.   CARDIOVASCULAR: S1, S2 normal. No murmurs, rubs, or gallops.  ABDOMEN: Soft, nontender, nondistended. Bowel sounds present. No organomegaly or mass.  EXTREMITIES: No cyanosis, clubbing or edema b/l.    NEUROLOGIC: Cranial nerves II through XII are intact. No focal Motor or sensory deficits b/l.   PSYCHIATRIC:  patient is alert and oriented x 3.  SKIN: No obvious rash, lesion, or ulcer.   LABORATORY PANEL:  CBC Recent Labs  Lab 08/16/17 0454  WBC 7.0  HGB 10.9*  HCT 33.2*  PLT 202    Chemistries  Recent Labs  Lab 08/15/17 0700  08/18/17 0525  NA 137   < > 140  K 5.6*   < > 4.9  CL 107   < > 108  CO2 21*   < > 24  GLUCOSE 234*   < > 107*  BUN 53*   < > 63*  CREATININE 2.48*   < > 2.52*  CALCIUM 8.9   < > 8.3*  AST 130*  --   --   ALT 102*  --   --   ALKPHOS 74  --   --   BILITOT 0.8  --   --    < > = values in this interval not displayed.   Cardiac Enzymes Recent Labs  Lab 08/16/17 0454  TROPONINI 7.67*   RADIOLOGY:  No results found. ASSESSMENT AND PLAN:   Cassandra DanielMargie Mccall is a 83 y.o. female brought to the ED from home via EMS with a chief complaint of shortness of breath.  EMS reports patient is on day 3 of Z-Pak for pneumonia.  Family states patient slid down in the bed and the family had a very hard time getting her back up  secondary to generalized weakness  1.Acuteonchronic respiratory failure hypoxia due to acute on chronic diastolic CHF with pleural effusion and possible PNA. -Off BiPAP - continue oxygen by nasal cannula, NEB PRN. -uses oxygen at night and prn daytime per family  2.Acute on chronic diastolic CHF with pleural effusion. Echo: Systolic function was normal. The estimated ejection fraction was in the range of 50% to 55%. -received Lasix IV 40 mg twice daily--now off of it. Creat stable  3. pneumonia.  - Continue Zithromax and Rocephin--changed to po abxs today - follow-up cultures--staph coag negative. - Robitussin as  needed  4.UTI. -on antibiotics.  5.Elevated troponin/acute coronary synndrome - Possible due to demanding ischemia, continue aspirin and Plavix -cont statin. - deferred invasive coronary evaluation for now given renal insuffiency. This will need to be readdressed when renal function improves per Dr. Lady GaryFath.  6.Acute on chronic renal failure stage IV.  -good uop and creatinine trending down  7.Hyperkalemia.   Improved with Kayexalate.  8.Abnormal liver function tests. suspected due to CHF  9.COPD. Stable continue home nebulizer.  10.PAD. Continue aspirin and Plavix.  11.Diabetes.  Sliding scale. Pt not on any meds at home   PT to see pt. D/c plans d/w family CM/CSW for d/c planning   Case discussed with Care Management/Social Worker. Management plans discussed with the patient, family and they are in agreement.  CODE STATUS: DNR  DVT Prophylaxis: heparin  TOTAL TIME TAKING CARE OF THIS PATIENT: 30 minutes.  >50% time spent on counselling and coordination of care  POSSIBLE D/C IN 1-2 DAYS, DEPENDING ON CLINICAL CONDITION.  Note: This dictation was prepared with Dragon dictation along with smaller phrase technology. Any transcriptional errors that result from this process are unintentional.  Enedina FinnerSona Neima Lacross M.D on 08/18/2017 at 8:18 AM  Between 7am to 6pm - Pager - [phone]  After 6pm go to [email or web address] - Social research officer, governmentpassword EPAS [facility]  [facility]  Office  [phone]  CC: Primary care physician; Lauro RegulusAnderson, Marshall W, MDPatient ID: Cassandra DanielMargie Mccall, female   DOB: 21-Jul-1929, 83 y.o.   MRN: [id number]

## 2017-08-18 NOTE — [place] FL2 (Signed)
[place] MEDICAID FL2 LEVEL OF CARE SCREENING TOOL     IDENTIFICATION  Patient Name: Cassandra DanielMargie Burnsed Birthdate: 05-03-1929 Sex: female Admission Date (Current Location): 08/15/2017  [place]   Facility and Address:  [facility], [address], [place], [place] [address]      Provider Number: [id number]  Attending Physician Name and Address:  Enedina FinnerPatel, Sona, MD  Relative Name and Phone Number:  Burnett CorrenteKaren Pollock (Daughter) [phone]    Current Level of Care: Hospital Recommended Level of Care: Skilled Nursing Facility Prior Approval Number:    Date Approved/Denied: 08/18/17 PASRR Number: [id number]  Discharge Plan: SNF    Current Diagnoses: Patient Active Problem List   Diagnosis Date Noted  . Acute respiratory failure with hypoxia (HCC) 08/15/2017    Orientation RESPIRATION BLADDER Height & Weight     Self, Time, Place, Situation  O2(2L o2) Continent Weight: 151 lb (68.5 kg) Height:  5\' 7"  (170.2 cm)  BEHAVIORAL SYMPTOMS/MOOD NEUROLOGICAL BOWEL NUTRITION STATUS      Continent Diet(Heart health/Carb modified)  AMBULATORY STATUS COMMUNICATION OF NEEDS Skin   Extensive Assist Verbally Normal                       Personal Care Assistance Level of Assistance  Bathing, Feeding, Dressing Bathing Assistance: Limited assistance Feeding assistance: Independent Dressing Assistance: Limited assistance     Functional Limitations Info             SPECIAL CARE FACTORS FREQUENCY  PT (By licensed PT)     PT Frequency: Up to 5X per day, 5 days per week              Contractures Contractures Info: Not present    Additional Factors Info  Code Status, Allergies, Insulin Sliding Scale, Psychotropic Code Status Info: DNR Allergies Info: Sulfa Antibiotics Psychotropic Info: Xanax Insulin Sliding Scale Info: Novolog: 0-15 units TID with meals and 0-5 units QHS       Current Medications (08/18/2017):   This is the current hospital active medication list Current Facility-Administered Medications  Medication Dose Route Frequency Provider Last Rate Last Dose  . 0.9 %  sodium chloride infusion  250 mL Intravenous PRN Shaune Pollackhen, Qing, MD      . acetaminophen (TYLENOL) tablet 650 mg  650 mg Oral Q6H PRN Shaune Pollackhen, Qing, MD      . albuterol (PROVENTIL) (2.5 MG/3ML) 0.083% nebulizer solution 2.5 mg  2.5 mg Nebulization Q2H PRN Shaune Pollackhen, Qing, MD      . ALPRAZolam Prudy Feeler(XANAX) tablet 0.125-0.25 mg  0.125-0.25 mg Oral QHS Shaune Pollackhen, Qing, MD   0.25 mg at 08/17/17 2111  . aspirin EC tablet 81 mg  81 mg Oral Daily Shaune Pollackhen, Qing, MD   81 mg at 08/18/17 0825  . atorvastatin (LIPITOR) tablet 80 mg  80 mg Oral q1800 Dalia HeadingFath, Kenneth A, MD   80 mg at 08/17/17 1759  . azithromycin (ZITHROMAX) tablet 250 mg  250 mg Oral Daily Merwyn KatosSimonds, David B, MD   250 mg at 08/18/17 [id number]  . bisacodyl (DULCOLAX) EC tablet 5 mg  5 mg Oral Daily PRN Shaune Pollackhen, Qing, MD      . cefUROXime (CEFTIN) tablet 500 mg  500 mg Oral BID WC Enedina FinnerPatel, Sona, MD   500 mg at 08/18/17 1223  . chlorhexidine (PERIDEX) 0.12 % solution 15 mL  15 mL Mouth Rinse BID Merwyn KatosSimonds, David B, MD   15 mL at 08/18/17 0826  . clopidogrel (PLAVIX) tablet  75 mg  75 mg Oral Daily Shaune Pollackhen, Qing, MD   75 mg at 08/18/17 0825  . famotidine (PEPCID) tablet 10 mg  10 mg Oral Daily Shaune Pollackhen, Qing, MD   10 mg at 08/18/17 0825  . guaiFENesin-dextromethorphan (ROBITUSSIN DM) 100-10 MG/5ML syrup 5 mL  5 mL Oral Q4H PRN Shaune Pollackhen, Qing, MD      . heparin injection 5,000 Units  5,000 Units Subcutaneous Q8H Shaune Pollackhen, Qing, MD   5,000 Units at 08/18/17 1441  . insulin aspart (novoLOG) injection 0-15 Units  0-15 Units Subcutaneous TID WC Merwyn KatosSimonds, David B, MD   2 Units at 08/18/17 1224  . insulin aspart (novoLOG) injection 0-5 Units  0-5 Units Subcutaneous QHS Merwyn KatosSimonds, David B, MD      . ipratropium-albuterol (DUONEB) 0.5-2.5 (3) MG/3ML nebulizer solution 3 mL  3 mL Nebulization TID Merwyn KatosSimonds, David B, MD   3 mL at 08/18/17 1410  .  magnesium oxide (MAG-OX) tablet 400 mg  400 mg Oral Daily Dalia HeadingFath, Kenneth A, MD   400 mg at 08/18/17 0825  . MEDLINE mouth rinse  15 mL Mouth Rinse q12n4p Merwyn KatosSimonds, David B, MD   15 mL at 08/18/17 1442  . memantine (NAMENDA) tablet 10 mg  10 mg Oral BID Shaune Pollackhen, Qing, MD   10 mg at 08/18/17 [id number]  . metoprolol succinate (TOPROL-XL) 24 hr tablet 25 mg  25 mg Oral Daily Merwyn KatosSimonds, David B, MD   25 mg at 08/18/17 0825  . ondansetron (ZOFRAN) tablet 4 mg  4 mg Oral Q6H PRN Shaune Pollackhen, Qing, MD       Or  . ondansetron [facility](ZOFRAN) injection 4 mg  4 mg Intravenous Q6H PRN Shaune Pollackhen, Qing, MD      . senna-docusate (Senokot-S) tablet 1 tablet  1 tablet Oral QHS PRN Shaune Pollackhen, Qing, MD      . sodium chloride flush (NS) 0.9 % injection 3 mL  3 mL Intravenous Q12H Shaune Pollackhen, Qing, MD   3 mL at 08/18/17 0827  . sodium chloride flush (NS) 0.9 % injection 3 mL  3 mL Intravenous PRN Shaune Pollackhen, Qing, MD         Discharge Medications: Please see discharge summary for a list of discharge medications.  Relevant Imaging Results:  Relevant Lab Results:   Additional Information SS# [id number]  Judi CongKaren M Donalda Job, LCSW

## 2017-08-19 LAB — BASIC METABOLIC PANEL
Anion gap: 8 (ref 5–15)
BUN: 66 mg/dL — ABNORMAL HIGH (ref 6–20)
CHLORIDE: 106 mmol/L (ref 101–111)
CO2: 26 mmol/L (ref 22–32)
Calcium: 8.4 mg/dL — ABNORMAL LOW (ref 8.9–10.3)
Creatinine, Ser: 2.56 mg/dL — ABNORMAL HIGH (ref 0.44–1.00)
GFR calc non Af Amer: 16 mL/min — ABNORMAL LOW (ref >60)
GFR, EST AFRICAN AMERICAN: 18 mL/min — AB (ref >60)
Glucose, Bld: 132 mg/dL — ABNORMAL HIGH (ref 65–99)
POTASSIUM: 4.9 mmol/L (ref 3.5–5.1)
SODIUM: 140 mmol/L (ref 135–145)

## 2017-08-19 LAB — GLUCOSE, CAPILLARY
GLUCOSE-CAPILLARY: 123 mg/dL — AB (ref 65–99)
GLUCOSE-CAPILLARY: 114 mg/dL — AB (ref 65–99)
GLUCOSE-CAPILLARY: 124 mg/dL — AB (ref 65–99)
Glucose-Capillary: 121 mg/dL — ABNORMAL HIGH (ref 65–99)

## 2017-08-19 MED ORDER — CEFUROXIME AXETIL 500 MG PO TABS
500.0000 mg | ORAL_TABLET | Freq: Every day | ORAL | Status: AC
  Administered 2017-08-20 – 2017-08-21 (×2): 500 mg via ORAL
  Filled 2017-08-19 (×2): qty 1

## 2017-08-19 NOTE — Clinical Social Work Note (Signed)
Clinical Social Work Assessment  Patient Details  Name: Cassandra Mccall MRN: [id number] Date of Birth: [birth year]  Date of referral:  08/19/17               Reason for consult:  Facility Placement                Permission sought to share information with:  Ocean[facility]ility Contact Representative Permission granted to share information::  Yes, Verbal Permission Granted  Name::        Agency::     Relationship::     Contact Information:     Housing/Transportation Living arrangements for the past 2 months:  Single Family Home Source of Information:  Medical Team, Adult Children Patient Interpreter Needed:  None Criminal Activity/Legal Involvement Pertinent to Current Situation/Hospitalization:  No - Comment as needed Significant Relationships:  Adult Children, Community Support Lives with:  Adult Children Do you feel safe going back to the place where you live?  Yes Need for family participation in patient care:  Yes (Comment)(Patient confused)  Care giving concerns:  PT recommendation for STR   Social Worker assessment / plan:  CSW spoke with the patient's daughter by telephone as the patient was sleeping. Cassandra BraunKaren gave verbal permission for a bed search and named [facility] as her preference. The CSW explained Medicare guidelines and answered all questions about what to expect at a SNF as the patient has never admitted to one.  At baseline, the patient has some memory problems and is able to ambulate with a walker. The patient lives with her daughter who is her main caregiver and has support from her son as well. The plan is for the patient to discharge when stable.  CSW will follow up with bed offers.  Employment status:  Retired Health and safety inspectornsurance information:  [employer] PT Recommendations:  Skilled Nursing Facility Information / Referral to community resources:  Skilled Nursing Facility  Patient/Family's Response to care:  The patient's daughter thanked the CSW  Patient/Family's Understanding  of and Emotional Response to Diagnosis, Current Treatment, and Prognosis:  The patient's family understands and agrees with the discharge plan.  Emotional Assessment Appearance:  Appears stated age Attitude/Demeanor/Rapport:  Lethargic(Patient was sleeping) Affect (typically observed):  Stable(Patient was sleeping) Orientation:  (Patient was sleeping/Could not assess. Baseline X3) Alcohol / Substance use:  Never Used Psych involvement (Current and /or in the community):  No (Comment)  Discharge Needs  Concerns to be addressed:  Care Coordination, Discharge Planning Concerns Readmission within the last 30 days:  No Current discharge risk:  None Barriers to Discharge:  Continued Medical Work up   [employer] CorporationKaren M Isidro Monks, LCSW 08/19/2017, 3:37 PM

## 2017-08-19 NOTE — Progress Notes (Signed)
PHARMACY NOTE:  ANTIMICROBIAL RENAL DOSAGE ADJUSTMENT  Current antimicrobial regimen includes a mismatch between antimicrobial dosage and estimated renal function.  As per policy approved by the Pharmacy & Therapeutics and Medical Executive Committees, the antimicrobial dosage will be adjusted accordingly.  Current antimicrobial dosage:  ceftin 500mg  BID  Indication: PNA  Renal Function:  Estimated Creatinine Clearance: 15.3 mL/min (A) (by C-G formula based on SCr of 2.56 mg/dL (H)). []      On intermittent HD, scheduled: []      On CRRT    Antimicrobial dosage has been changed to:  ceftin 500mg  daily  Additional comments:   Thank you for allowing pharmacy to be a part of this patient's care.  Olene FlossMelissa D Kathelyn Gombos, Pharm.D, BCPS Clinical Pharmacist  08/19/2017 12:26 PM

## 2017-08-19 NOTE — Plan of Care (Signed)
  Progressing Clinical Measurements: Ability to maintain clinical measurements within normal limits will improve 08/19/2017 1035 - Progressing by Donald ProseBerry, Labrina Lines L, RN Pain Managment: General experience of comfort will improve 08/19/2017 1035 - Progressing by Donald ProseBerry, Sohan Potvin L, RN Note PRN medications Safety: Ability to remain free from injury will improve 08/19/2017 1035 - Progressing by Donald ProseBerry, Henri Guedes L, RN Note Remains free from fall, fall precautions in place, non skid socks when oob   Not Progressing Clinical Measurements: Will remain free from infection 08/19/2017 1035 - Not Progressing by Donald ProseBerry, Fleda Pagel L, RN Note Remains on PO antibiotics Diagnostic test results will improve 08/19/2017 1035 - Not Progressing by Donald ProseBerry, Taliah Porche L, RN Note BUN/Crea remain elevated at 66/2.56

## 2017-08-19 NOTE — Plan of Care (Signed)
Weights trending down since admission;breathing better.

## 2017-08-19 NOTE — Progress Notes (Signed)
[facility] [place] [facility]   PATIENT NAME: Cassandra Mccall    MR#:  [id number]  DATE OF BIRTH:  1928/10/21  SUBJECTIVE:  Feels better Weak  Baseline function is bed--bathroom--kitchen--bed per dter , uses rollator at home, no falls recently  REVIEW OF SYSTEMS:   Review of Systems  Constitutional: Negative for chills, fever and weight loss.  HENT: Negative for ear discharge, ear pain and nosebleeds.   Eyes: Negative for blurred vision, pain and discharge.  Respiratory: Positive for cough. Negative for sputum production, shortness of breath, wheezing and stridor.   Cardiovascular: Negative for chest pain, palpitations, orthopnea and PND.  Gastrointestinal: Negative for abdominal pain, diarrhea, nausea and vomiting.  Genitourinary: Negative for frequency and urgency.  Musculoskeletal: Negative for back pain and joint pain.  Neurological: Positive for weakness. Negative for sensory change, speech change and focal weakness.  Psychiatric/Behavioral: Negative for depression and hallucinations. The patient is not nervous/anxious.    Tolerating Diet:yes Tolerating PT: pending  DRUG ALLERGIES:   Allergies  Allergen Reactions  . Sulfa Antibiotics     VITALS:  Blood pressure (!) 125/50, pulse 76, temperature 97.7 F (36.5 C), temperature source Oral, resp. rate 16, height 5\' 7"  (1.702 m), weight 68.9 kg (152 lb), SpO2 99 %.  PHYSICAL EXAMINATION:   Physical Exam  GENERAL:  83 y.o.-year-old patient lying in the bed with no acute distress.  EYES: Pupils equal, round, reactive to light and accommodation. No scleral icterus. Extraocular muscles intact.  HEENT: Head atraumatic, normocephalic. Oropharynx and nasopharynx clear.  NECK:  Supple, no jugular venous distention. No thyroid enlargement, no tenderness.  LUNGS: Normal breath sounds bilaterally, no wheezing, rales, rhonchi. No use of accessory muscles of respiration.  CARDIOVASCULAR: S1, S2  normal. No murmurs, rubs, or gallops.  ABDOMEN: Soft, nontender, nondistended. Bowel sounds present. No organomegaly or mass.  EXTREMITIES: No cyanosis, clubbing or edema b/l.    NEUROLOGIC: Cranial nerves II through XII are intact. No focal Motor or sensory deficits b/l.   PSYCHIATRIC:  patient is alert and oriented x 3.  SKIN: No obvious rash, lesion, or ulcer.   LABORATORY PANEL:  CBC Recent Labs  Lab 08/16/17 0454  WBC 7.0  HGB 10.9*  HCT 33.2*  PLT 202    Chemistries  Recent Labs  Lab 08/15/17 0700  08/19/17 0547  NA 137   < > 140  K 5.6*   < > 4.9  CL 107   < > 106  CO2 21*   < > 26  GLUCOSE 234*   < > 132*  BUN 53*   < > 66*  CREATININE 2.48*   < > 2.56*  CALCIUM 8.9   < > 8.4*  AST 130*  --   --   ALT 102*  --   --   ALKPHOS 74  --   --   BILITOT 0.8  --   --    < > = values in this interval not displayed.   Cardiac Enzymes Recent Labs  Lab 08/16/17 0454  TROPONINI 7.67*   RADIOLOGY:  No results found. ASSESSMENT AND PLAN:   Cassandra Mccall is a 83 y.o. female brought to the ED from home via EMS with a chief complaint of shortness of breath.  EMS reports patient is on day 3 of Z-Pak for pneumonia.  Family states patient slid down in the bed and the family had a very hard time getting her back up secondary to generalized weakness  1.Acuteonchronic respiratory failure hypoxia due to acute on chronic diastolic CHF with pleural effusion and possible PNA. -Off BiPAP - continue oxygen by nasal cannula, NEB PRN. -uses oxygen at night and prn daytime per family  2.Acute on chronic diastolic CHF with pleural effusion. Echo: Systolic function was normal. The estimated ejection fraction was in the range of 50% to 55%. -received Lasix IV 40 mg twice daily--now off of it. Creat stable  3. pneumonia.  - Continue Zithromax and Rocephin--changed to po abxs today - follow-up cultures--staph coag negative. - Robitussin as needed  4.UTI. -on  antibiotics.  5.Elevated troponin/acute coronary synndrome - Possible due to demanding ischemia, continue aspirin and Plavix -cont statin. - deferred invasive coronary evaluation for now given renal insuffiency. This will need to be readdressed when renal function improves per Dr. Lady GaryFath.  6.Acute on chronic renal failure stage IV.  -good uop and creatinine trending down  7.Hyperkalemia.   Improved with Kayexalate.  8.Abnormal liver function tests. suspected due to CHF  9.COPD. Stable continue home nebulizer.  10.PAD. Continue aspirin and Plavix.  11.Diabetes.  Sliding scale. Pt not on any meds at home   . D/c plans d/w family CSW for d/c planning to rehab    Case discussed with Care Management/Social Worker. Management plans discussed with the patient, family and they are in agreement.  CODE STATUS: DNR  DVT Prophylaxis: heparin  TOTAL TIME TAKING CARE OF THIS PATIENT: 30 minutes.  >50% time spent on counselling and coordination of care  POSSIBLE D/C IN 1-2 DAYS, DEPENDING ON CLINICAL CONDITION.  Note: This dictation was prepared with Dragon dictation along with smaller phrase technology. Any transcriptional errors that result from this process are unintentional.  Enedina FinnerSona Lynn Recendiz M.D on 08/19/2017 at 12:46 PM  Between 7am to 6pm - Pager - [phone]  After 6pm go to [email or web address] - Social research officer, governmentpassword EPAS [facility]  [facility]  Office  [phone]  CC: Primary care physician; Lauro RegulusAnderson, Marshall W, MDPatient ID: Cassandra Mccall, female   DOB: [birth year], 83 y.o.   MRN: [id number]

## 2017-08-20 DIAGNOSIS — R7989 Other specified abnormal findings of blood chemistry: Secondary | ICD-10-CM

## 2017-08-20 DIAGNOSIS — G308 Other Alzheimer's disease: Secondary | ICD-10-CM

## 2017-08-20 DIAGNOSIS — R778 Other specified abnormalities of plasma proteins: Secondary | ICD-10-CM

## 2017-08-20 DIAGNOSIS — N184 Chronic kidney disease, stage 4 (severe): Secondary | ICD-10-CM

## 2017-08-20 DIAGNOSIS — R748 Abnormal levels of other serum enzymes: Secondary | ICD-10-CM

## 2017-08-20 DIAGNOSIS — F0280 Dementia in other diseases classified elsewhere without behavioral disturbance: Secondary | ICD-10-CM

## 2017-08-20 DIAGNOSIS — G309 Alzheimer's disease, unspecified: Secondary | ICD-10-CM

## 2017-08-20 DIAGNOSIS — Z515 Encounter for palliative care: Secondary | ICD-10-CM

## 2017-08-20 DIAGNOSIS — R0603 Acute respiratory distress: Secondary | ICD-10-CM

## 2017-08-20 LAB — BASIC METABOLIC PANEL
ANION GAP: 10 (ref 5–15)
BUN: 64 mg/dL — ABNORMAL HIGH (ref 6–20)
CHLORIDE: 107 mmol/L (ref 101–111)
CO2: 23 mmol/L (ref 22–32)
Calcium: 8.3 mg/dL — ABNORMAL LOW (ref 8.9–10.3)
Creatinine, Ser: 2.4 mg/dL — ABNORMAL HIGH (ref 0.44–1.00)
GFR calc non Af Amer: 17 mL/min — ABNORMAL LOW (ref >60)
GFR, EST AFRICAN AMERICAN: 20 mL/min — AB (ref >60)
Glucose, Bld: 107 mg/dL — ABNORMAL HIGH (ref 65–99)
Potassium: 4.8 mmol/L (ref 3.5–5.1)
Sodium: 140 mmol/L (ref 135–145)

## 2017-08-20 LAB — GLUCOSE, CAPILLARY
GLUCOSE-CAPILLARY: 174 mg/dL — AB (ref 65–99)
Glucose-Capillary: 109 mg/dL — ABNORMAL HIGH (ref 65–99)
Glucose-Capillary: 110 mg/dL — ABNORMAL HIGH (ref 65–99)
Glucose-Capillary: 134 mg/dL — ABNORMAL HIGH (ref 65–99)

## 2017-08-20 MED ORDER — ALPRAZOLAM 0.5 MG PO TABS
0.5000 mg | ORAL_TABLET | Freq: Every day | ORAL | Status: DC
  Administered 2017-08-20: 0.5 mg via ORAL
  Filled 2017-08-20: qty 1

## 2017-08-20 NOTE — Clinical Social Work Placement (Addendum)
   CLINICAL SOCIAL WORK PLACEMENT  NOTE  Date:  08/20/2017  Patient Details  Name: Cassandra DanielMargie Mccall MRN: [id number] Date of Birth: July 20, 1929  Clinical Social Work is seeking post-discharge placement for this patient at the Skilled  Nursing Facility level of care (*CSW will initial, date and re-position this form in  chart as items are completed):  Yes   Patient/family provided with [place] Clinical Social Work Department's list of facilities offering this level of care within the geographic area requested by the patient (or if unable, by the patient's family).  Yes   Patient/family informed of their freedom to choose among providers that offer the needed level of care, that participate in Medicare, Medicaid or managed care program needed by the patient, have an available bed and are willing to accept the patient.  Yes   Patient/family informed of [facility]'s ownership interest in [facility] and [facility], as well as of the fact that they are under no obligation to receive care at these facilities.  PASRR submitted to EDS on 08/19/17     PASRR number received on 08/19/17     Existing PASRR number confirmed on       FL2 transmitted to all facilities in geographic area requested by pt/family on 08/19/17     FL2 transmitted to all facilities within larger geographic area on       Patient informed that his/her managed care company has contracts with or will negotiate with certain facilities, including the following:         08/21/17   Patient/family informed of bed offers received.  [facility]     Physician recommends and patient chooses bed at      [facility] on   08-21-17   Patient to be [facility] facility [facility]  [facility]     Patient family notified on   08-21-17 of transfer.  Name of family member notified:   Patient's daughter Clydie BraunKaren.     PHYSICIAN Please sign DNR     Additional  Comment:    _______________________________________________ Darleene CleaverAnterhaus, Amed Datta R, LCSWA 08/20/2017, 6:26 PM

## 2017-08-20 NOTE — Progress Notes (Signed)
[facility] [place], [place] 08/20/17  Subjective:   Patient's daughter is at bedside. No acute events reporte.  Breathing appears to be slightly better although she continues to have a wet cough Serum creatinine slightly further improved to 2.40    Objective:  Vital signs in last 24 hours:  Temp:  [97.9 F (36.6 C)-98.1 F (36.7 C)] 98 F (36.7 C) (12/31 0307) Pulse Rate:  [70-88] 70 (12/31 0745) Resp:  [17-18] 18 (12/31 0745) BP: (129-169)/(52-65) 129/52 (12/31 0745) SpO2:  [94 %-98 %] 98 % (12/31 0745) Weight:  [68.9 kg (152 lb)] 68.9 kg (152 lb) (12/31 0307)  Weight change: 0 kg (0 lb) Filed Weights   08/18/17 0431 08/19/17 0305 08/20/17 0307  Weight: 68.5 kg (151 lb) 68.9 kg (152 lb) 68.9 kg (152 lb)    Intake/Output:    Intake/Output Summary (Last 24 hours) at 08/20/2017 0958 Last data filed at 08/20/2017 0304 Gross per 24 hour  Intake 720 ml  Output 1000 ml  Net -280 ml     Physical Exam: General:  Lying in the bed, no acute distress  HEENT  anicteric, moist oral mucous membranes  Neck  supple  Pulm/lungs  oxygen supplementation with nasal cannula, mild diffuse rhonchi, b/l crackles at bases  CVS/Heart  tachycardic  Abdomen:   Soft, nontender  Extremities:  Trace to 1+ edema  Neurologic:  Alert, able to answer few simple questions  Skin:  No acute rashes          Basic Metabolic Panel:  Recent Labs  Lab 08/16/17 0454 08/17/17 0622 08/18/17 0525 08/19/17 0547 08/20/17 0459  NA 142 143 140 140 140  K 3.4* 3.4* 4.9 4.9 4.8  CL 108 108 108 106 107  CO2 22 24 24 26 23   GLUCOSE 125* 104* 107* 132* 107*  BUN 57* 59* 63* 66* 64*  CREATININE 2.83* 2.72* 2.52* 2.56* 2.40*  CALCIUM 7.9* 8.1* 8.3* 8.4* 8.3*     CBC: Recent Labs  Lab 08/15/17 0700 08/16/17 0454  WBC 12.4* 7.0  NEUTROABS 11.4*  --   HGB 12.5 10.9*  HCT 39.5 33.2*  MCV 95.1 93.5  PLT 237 202     No results found for: HEPBSAG, HEPBSAB,  HEPBIGM    Microbiology:  Recent Results (from the past 240 hour(s))  Blood Culture (routine x 2)     Status: Abnormal   Collection Time: 08/15/17  7:00 AM  Result Value Ref Range Status   Specimen Description   Final    BLOOD LEFT FINGER Performed at [facility], [address] [place], [place] [address]    Special Requests   Final    BOTTLES DRAWN AEROBIC AND ANAEROBIC BCAV Performed at [facility], [address] [place], [place] [address]    Culture  Setup Time   Final    GRAM POSITIVE COCCI IN BOTH AEROBIC AND ANAEROBIC BOTTLES CRITICAL RESULT CALLED TO, READ BACK BY AND VERIFIED WITH: SHEEMA HALLAJI ON 08/16/17 AT 0823 QSD    Culture (A)  Final    STAPHYLOCOCCUS SPECIES (COAGULASE NEGATIVE) THE SIGNIFICANCE OF ISOLATING THIS ORGANISM FROM A SINGLE VENIPUNCTURE CANNOT BE PREDICTED WITHOUT FURTHER CLINICAL AND CULTURE CORRELATION. SUSCEPTIBILITIES AVAILABLE ONLY ON REQUEST. Performed at [facility], [address] [place], [place] [address]    Report Status 08/17/2017 FINAL  Final  Blood Culture ID Panel (Reflexed)     Status: Abnormal   Collection Time: 08/15/17  7:00 AM  Result Value Ref  Range Status   Enterococcus species NOT DETECTED NOT DETECTED Final   Listeria monocytogenes NOT DETECTED NOT DETECTED Final   Staphylococcus species DETECTED (A) NOT DETECTED Final    Comment: Methicillin (oxacillin) resistant coagulase negative staphylococcus. Possible blood culture contaminant (unless isolated from more than one blood culture draw or clinical case suggests pathogenicity). No antibiotic treatment is indicated for blood  culture contaminants. CRITICAL RESULT CALLED TO, READ BACK BY AND VERIFIED WITH: SHEEMA HALLAJI ON 08/16/17 AT [id number] QSD    Staphylococcus aureus NOT DETECTED NOT DETECTED Final   Methicillin resistance DETECTED (A) NOT DETECTED Final    Comment: CRITICAL RESULT CALLED TO, READ BACK BY AND VERIFIED WITH: SHEEMA HALLAJI  ON 08/16/17 AT 0823 QSD    Streptococcus species NOT DETECTED NOT DETECTED Final   Streptococcus agalactiae NOT DETECTED NOT DETECTED Final   Streptococcus pneumoniae NOT DETECTED NOT DETECTED Final   Streptococcus pyogenes NOT DETECTED NOT DETECTED Final   Acinetobacter baumannii NOT DETECTED NOT DETECTED Final   Enterobacteriaceae species NOT DETECTED NOT DETECTED Final   Enterobacter cloacae complex NOT DETECTED NOT DETECTED Final   Escherichia coli NOT DETECTED NOT DETECTED Final   Klebsiella oxytoca NOT DETECTED NOT DETECTED Final   Klebsiella pneumoniae NOT DETECTED NOT DETECTED Final   Proteus species NOT DETECTED NOT DETECTED Final   Serratia marcescens NOT DETECTED NOT DETECTED Final   Haemophilus influenzae NOT DETECTED NOT DETECTED Final   Neisseria meningitidis NOT DETECTED NOT DETECTED Final   Pseudomonas aeruginosa NOT DETECTED NOT DETECTED Final   Candida albicans NOT DETECTED NOT DETECTED Final   Candida glabrata NOT DETECTED NOT DETECTED Final   Candida krusei NOT DETECTED NOT DETECTED Final   Candida parapsilosis NOT DETECTED NOT DETECTED Final   Candida tropicalis NOT DETECTED NOT DETECTED Final    Comment: Performed at [facility], [address] [place], [place] [address]  MRSA PCR Screening     Status: None   Collection Time: 08/15/17 10:51 AM  Result Value Ref Range Status   MRSA by PCR NEGATIVE NEGATIVE Final    Comment:        The GeneXpert MRSA Assay (FDA approved for NASAL specimens only), is one component of a comprehensive MRSA colonization surveillance program. It is not intended to diagnose MRSA infection nor to guide or monitor treatment for MRSA infections. Performed at [facility], [address] [place], [place] [address]   Urine culture     Status: None   Collection Time: 08/15/17  1:31 PM  Result Value Ref Range Status   Specimen Description   Final    URINE, RANDOM Performed at [facility], [address] [place], [place] [address]    Special Requests   Final    NONE Performed at [facility], [address] [place], [place] [address]    Culture   Final    NO GROWTH Performed at [facility], [address] [place], [place] [address]    Report Status 08/16/2017 FINAL  Final    Coagulation Studies: No results for input(s): LABPROT, INR in the last 72 hours.  Urinalysis: No results for input(s): COLORURINE, LABSPEC, PHURINE, GLUCOSEU, HGBUR, BILIRUBINUR, KETONESUR, PROTEINUR, UROBILINOGEN, NITRITE, LEUKOCYTESUR in the last 72 hours.  Invalid input(s): APPERANCEUR    Imaging: No results found.   Medications:   . sodium chloride     . ALPRAZolam  0.125-0.25 mg Oral QHS  . aspirin EC  81 mg Oral Daily  .  atorvastatin  80 mg Oral q1800  . cefUROXime  500 mg Oral Daily  . chlorhexidine  15 mL Mouth Rinse BID  . clopidogrel  75 mg Oral Daily  . famotidine  10 mg Oral Daily  . heparin  5,000 Units Subcutaneous Q8H  . insulin aspart  0-15 Units Subcutaneous TID WC  . insulin aspart  0-5 Units Subcutaneous QHS  . ipratropium-albuterol  3 mL Nebulization TID  . magnesium oxide  400 mg Oral Daily  . mouth rinse  15 mL Mouth Rinse q12n4p  . memantine  10 mg Oral BID  . metoprolol succinate  25 mg Oral Daily  . sodium chloride flush  3 mL Intravenous Q12H   sodium chloride, acetaminophen **OR** [DISCONTINUED] acetaminophen, albuterol, bisacodyl, guaiFENesin-dextromethorphan, ondansetron **OR** ondansetron (ZOFRAN) IV, senna-docusate, sodium chloride flush  Assessment/ Plan:  83 y.o.caucsian female with medical problems of type 2 diabetes, insulin-dependent, depression, chronic bronchitis, peripheral vascular disease, Alzheimer's dementia, chronic kidney disease, was admitted on 08/15/2017 with shortness of breath.   1.  Acute renal failure 2.  Chronic kidney disease stage IV 3.  Urinary tract infection 4.  MRSE(coag neg staph) bacteremia 5.   Bilateral pleural effusions 6.  Pneumonia  Acute kidney injury likely related to concurrent illness.  Patient has underlying chronic kidney disease likely from atherosclerosis and diabetes. Shortness of breath is likely combination of pleural effusions, possibly underlying pneumonia and mild fluid overload.  Would avoid IV fluid supplementation. Serum creatinine has improved slightly.  Urine output is satisfactory. Continue supportive care No acute indication for dialysis at present Discussed with patient's daughter that if she would like, patient can be evaluated as outpatient for dialysis once she is discharged and back to her baseline health.  Further evaluation for dialysis will be done based on patient's recovery.     LOS: 5 Vaden Becherer Thedore Mins 12/31/20189:58 AM  [facility] [place], [place] [phone]

## 2017-08-20 NOTE — Progress Notes (Signed)
Family Meeting Note  Advance Directive:yes  Today a meeting took place with the Patient and Daughter.  Patient is unable to participate due ZO:XWRUEAto:Lacked capacity Sleepy   The following clinical team members were present during this meeting:MD  The following were discussed:Patient's diagnosis:   83 y.o. female  with past medical history of PAD, carotid stenosis, alzheimers dementia, CKD IV, CHF, COPD on home oxygen, DM who was admitted on 08/15/2017 with shortness of breath.  Hospital work up reveals acute on chronic kidney failure and likely NSTEMI (trop 11.17)  Patient's progosis: < 12 months and Goals for treatment: DNR  Additional follow-up to be provided: Palliative care consultation, likely discharge to skilled nursing facility with palliative care to follow with transition to hospice  Time spent during discussion:20 minutes  Delfino LovettVipul Eather Chaires, MD

## 2017-08-20 NOTE — Consult Note (Signed)
Consultation Note Date: 08/20/2017   Patient Name: Cassandra Mccall  DOB: 09/19/1928  MRN: [id number]  Age / Sex: 83 y.o., female  PCP: Lauro RegulusAnderson, Marshall W, MD Referring Physician: Delfino LovettShah, Vipul, MD  Reason for Consultation: Establishing goals of care  HPI/Patient Profile: 83 y.o. female  with past medical history of PAD, carotid stenosis, alzheimers dementia, CKD IV, CHF, COPD on home oxygen, DM who was admitted on 08/15/2017 with shortness of breath.  Hospital work up reveals acute on chronic kidney failure and likely NSTEMI (trop 11/17).   Clinical Assessment and Goals of Care:  I have reviewed medical records including EPIC notes, labs and imaging, received report from the care team, assessed the patient and then talked on the phone with her daughter Cassandra Mccall(Cass[place], [place]) for approximately 1 hour  to discuss diagnosis prognosis, GOC, EOL wishes, disposition and options.  I introduced Palliative Medicine as specialized medical care for people living with serious illness. It focuses on providing relief from the symptoms and stress of a serious illness. The goal is to improve quality of life for both the patient and the family.  We discussed a brief life review of the patient. She retired from the [employer]ic[employer], [employer].  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  She was a very strong independent woman.  She had 3 children but unfortunately lost 1 of her daughters.  Cassandra Mccall and I spent a great deal of time discussing the relationship between her and her brother.  Her brother does not accept her mother's decline and blames Cassandra Mccall for it.  He accuses Cassandra Mccall of wanting their mother "out of the way".   I offered multiple times to speak with her brother about the patient's medical condition.  She plans to give him my contact information.  We discussed her current  illness and what it means in the larger context of her on-going co-morbidities.  Natural disease trajectory and expectations at EOL were discussed.  Given her advanced cardiac disease, advanced chronic kidney disease, COPD, advanced vascular disease and alzheimer's dementia her prognosis is poor.  My feeling is that she will not be able to participate well in rehab.  I recommended hospice care to Cassandra Mccall.  However, Cassandra Mccall feels her mother needs an attempt at rehab.  The difference between aggressive medical intervention and comfort care was considered in light of the patient's goals of care.  Cassandra Mccall [facility](HCPOA) wants her mother to be well cared for and comfortable.  She does not want to artificially extend her life.  The patient had previously opted for no life support at end of life - as well as no feeding tube.   Cassandra Mccall feels strongly that no hemodialysis is in line with her mother's wishes.  Hospice and Palliative Care services outpatient were explained and offered.  Questions and concerns were addressed.   The family was encouraged to call with questions or concerns.   Addendum:  I spoke to the patient's son,  Cassandra Mccall.  Cassandra Mccall was very receptive to the information about his mother.  He appreciated the information about her heart, lungs, kidneys, vasculature and dementia.  I advised him to expect a rapid decline given her alzheimer's dementia.  I also advised him that if he decided to engage Hospice after trying rehab at SNF that his mother is eligible.  I advised him to allow her to do what she wants to do to be happy and comfortable as her time is limited.    Primary Decision Maker:  HCPOA daughter Cassandra Mccall    SUMMARY OF RECOMMENDATIONS    D/C to SNF with Palliative to follow  PMT will follow up on Wednesday with the family if the patient is still in the hospital.   Code Status/Advance Care Planning:  DNR  NO PEG  Per Cassandra Braun, No hemodialysis is in line with her mother's  previously stated wishes for no artificial life support at end of life.   Prognosis:  Less than 6 months  given her advanced cardiac disease, advanced chronic kidney disease, COPD, advanced vascular disease and alzheimer's dementia     Discharge Planning: Skilled Nursing Facility for rehab with Palliative care service follow-up      Primary Diagnoses: Present on Admission: . Acute respiratory failure with hypoxia (HCC)   I have reviewed the medical record, interviewed the patient and family, and examined the patient. The following aspects are pertinent.  Past Medical History:  Diagnosis Date  . Alzheimer disease   . CHF (congestive heart failure) (HCC)   . Chronic kidney disease   . COPD (chronic obstructive pulmonary disease) (HCC)   . Diabetes mellitus without complication (HCC)   . Hypertension   . PAD (peripheral artery disease) (HCC)   . PVD (peripheral vascular disease) (HCC)    Social History   Socioeconomic History  . Marital status: Widowed    Spouse name: None  . Number of children: None  . Years of education: None  . Highest education level: None  Social Needs  . Financial resource strain: None  . Food insecurity - worry: None  . Food insecurity - inability: None  . Transportation needs - medical: None  . Transportation needs - non-medical: None  Occupational History  . None  Tobacco Use  . Smoking status: Former Smoker    Types: Cigarettes  . Smokeless tobacco: Never Used  Substance and Sexual Activity  . Alcohol use: No    Frequency: Never  . Drug use: No  . Sexual activity: Not Currently  Other Topics Concern  . None  Social History Narrative  . None   Family History  Problem Relation Age of Onset  . COPD Mother   . Heart failure Mother   . Alzheimer's disease Father    Scheduled Meds: . ALPRAZolam  0.5 mg Oral QHS  . aspirin EC  81 mg Oral Daily  . atorvastatin  80 mg Oral q1800  . cefUROXime  500 mg Oral Daily  . chlorhexidine  15  mL Mouth Rinse BID  . clopidogrel  75 mg Oral Daily  . famotidine  10 mg Oral Daily  . heparin  5,000 Units Subcutaneous Q8H  . insulin aspart  0-15 Units Subcutaneous TID WC  . insulin aspart  0-5 Units Subcutaneous QHS  . ipratropium-albuterol  3 mL Nebulization TID  . magnesium oxide  400 mg Oral Daily  . mouth rinse  15 mL Mouth Rinse q12n4p  . memantine  10 mg Oral BID  .  metoprolol succinate  25 mg Oral Daily  . sodium chloride flush  3 mL Intravenous Q12H   Continuous Infusions: . sodium chloride     PRN Meds:.sodium chloride, acetaminophen **OR** [DISCONTINUED] acetaminophen, albuterol, bisacodyl, guaiFENesin-dextromethorphan, ondansetron **OR** ondansetron (ZOFRAN) IV, senna-docusate, sodium chloride flush Allergies  Allergen Reactions  . Sulfa Antibiotics    Review of Systems patient too lethargic to answer  Physical Exam well developed elderly female, lethargic after receiving a benzodiazepine for sleep, awakens to touch. CV rrr with murmur Resp no distress Abdomen soft, nt, nd  Vital Signs: BP (!) 129/52   Pulse 70   Temp 98 F (36.7 C) (Oral)   Resp 18   Ht 5\' 7"  (1.702 m)   Wt 68.9 kg (152 lb)   SpO2 98%   BMI 23.81 kg/m  Pain Assessment: No/denies pain   Pain Score: 0-No pain   SpO2: SpO2: 98 % O2 Device:SpO2: 98 % O2 Flow Rate: .O2 Flow Rate (L/min): 2 L/min  IO: Intake/output summary:   Intake/Output Summary (Last 24 hours) at 08/20/2017 1413 Last data filed at 08/20/2017 1354 Gross per 24 hour  Intake 960 ml  Output 1000 ml  Net -40 ml    LBM: Last BM Date: 08/20/17 Baseline Weight: Weight: 72.7 kg (160 lb 4.4 oz) Most recent weight: Weight: 68.9 kg (152 lb)     Palliative Assessment/Data: 30%     Time In: 3:00 Time Out: 4:21 Time Total: 81 min Greater than 50%  of this time was spent counseling and coordinating care related to the above assessment and plan.  Signed by: Norvel RichardsMarianne Ceola Para, PA-C Palliative Medicine Pager:  [phone]  Please contact Palliative Medicine Team phone at [phone] for questions and concerns.  For individual provider: See Loretha StaplerAmion

## 2017-08-20 NOTE — Plan of Care (Signed)
  Progressing Activity: Risk for activity intolerance will decrease 08/20/2017 0408 - Progressing by Dorna LeitzNesbitt, Shervin Cypert M, RN Safety: Ability to remain free from injury will improve 08/20/2017 0408 - Progressing by Dorna LeitzNesbitt, Tyshika Baldridge M, RN

## 2017-08-20 NOTE — Progress Notes (Signed)
[facility] - [place] at [facility]   PATIENT NAME: Cassandra Mccall    MR#:  [id number]  DATE OF BIRTH:  1929/03/09  SUBJECTIVE:  Coughing a lot, did not sleep all night yesterday.  Sleepy patient now.  Daughter at bedside REVIEW OF SYSTEMS:   Review of Systems  Constitutional: Negative for chills, fever and weight loss.  HENT: Negative for ear discharge, ear pain and nosebleeds.   Eyes: Negative for blurred vision, pain and discharge.  Respiratory: Positive for cough. Negative for sputum production, shortness of breath, wheezing and stridor.   Cardiovascular: Negative for chest pain, palpitations, orthopnea and PND.  Gastrointestinal: Negative for abdominal pain, diarrhea, nausea and vomiting.  Genitourinary: Negative for frequency and urgency.  Musculoskeletal: Negative for back pain and joint pain.  Neurological: Positive for weakness. Negative for sensory change, speech change and focal weakness.  Psychiatric/Behavioral: Negative for depression and hallucinations. The patient is not nervous/anxious.    Tolerating Diet:yes Tolerating PT: STR DRUG ALLERGIES:   Allergies  Allergen Reactions  . Sulfa Antibiotics     VITALS:  Blood pressure (!) 131/55, pulse 71, temperature 98.2 F (36.8 C), temperature source Oral, resp. rate 18, height 5\' 7"  (1.702 m), weight 68.9 kg (152 lb), SpO2 99 %.  PHYSICAL EXAMINATION:   Physical Exam  GENERAL:  83 y.o.-year-old patient lying in the bed with no acute distress.  EYES: Pupils equal, round, reactive to light and accommodation. No scleral icterus. Extraocular muscles intact.  HEENT: Head atraumatic, normocephalic. Oropharynx and nasopharynx clear.  NECK:  Supple, no jugular venous distention. No thyroid enlargement, no tenderness.  LUNGS: Normal breath sounds bilaterally, no wheezing, rales, rhonchi. No use of accessory muscles of respiration.  CARDIOVASCULAR: S1, S2 normal. No murmurs, rubs, or gallops.   ABDOMEN: Soft, nontender, nondistended. Bowel sounds present. No organomegaly or mass.  EXTREMITIES: No cyanosis, clubbing or edema b/l.    NEUROLOGIC: Cranial nerves II through XII are intact. No focal Motor or sensory deficits b/l.   PSYCHIATRIC:  patient is alert and oriented x 3. sleepy SKIN: No obvious rash, lesion, or ulcer.   LABORATORY PANEL:  CBC Recent Labs  Lab 08/16/17 0454  WBC 7.0  HGB 10.9*  HCT 33.2*  PLT 202    Chemistries  Recent Labs  Lab 08/15/17 0700  08/20/17 0459  NA 137   < > 140  K 5.6*   < > 4.8  CL 107   < > 107  CO2 21*   < > 23  GLUCOSE 234*   < > 107*  BUN 53*   < > 64*  CREATININE 2.48*   < > 2.40*  CALCIUM 8.9   < > 8.3*  AST 130*  --   --   ALT 102*  --   --   ALKPHOS 74  --   --   BILITOT 0.8  --   --    < > = values in this interval not displayed.   Cardiac Enzymes Recent Labs  Lab 08/16/17 0454  TROPONINI 7.67*   RADIOLOGY:  No results found. ASSESSMENT AND PLAN:  Cassandra DanielMargie Bohannon is a 83 y.o. female brought to the ED from home via EMS with a chief complaint of shortness of breath.  EMS reports patient is on day 3 of Z-Pak for pneumonia.  Family states patient slid down in the bed and the family had a very hard time getting her back up secondary to generalized weakness  1.Acuteonchronic respiratory failure hypoxia  due to acute on chronic diastolic CHF with pleural effusion and possible PNA. - continue oxygen by nasal cannula, NEB PRN. -uses oxygen at night and prn daytime per family  2.Acute on chronic diastolic CHF with pleural effusion. Echo: Systolic function was normal. The estimated ejection fraction was in the range of 50% to 55%. Creat 2.4  3. pneumonia.  - Continue Zithromax and Rocephin--stop Ceftin tomorrow finish total course of antibiotic - Blood cultures- there is no bacteremia - coagulase-negative staph is contaminant - Robitussin as needed  4.UTI. -on ceftin  5. NSTEMI: Troponin peak at 11.17 -  Continue aspirin and Plavix -cont statin. - deferred invasive coronary evaluation for now given renal insuffiency. This will need to be readdressed when renal function improves per Dr. Lady GaryFath. -Please note patient also has 100% carotid blockage on the left and has significant vascular disease in her lower extremities and is followed by vascular surgery  6.Acute on chronic renal failure stage IV.  -good uop and creatinine trending down - 2.4 today  7.Hyperkalemia.   Improved with Kayexalate.  8.Abnormal liver function tests. suspected due to CHF  9.COPD. Stable continue home nebulizer.  10.PAD. Continue aspirin and Plavix.  follows with vascular surgery  11.Diabetes.  Sliding scale. Pt not on any meds at home   . D/c plans d/w daughter at bedside CSW for d/c planning to rehab    Case discussed with Care Management/Social Worker. Management plans discussed with the patient, family and they are in agreement.  CODE STATUS: DNR  DVT Prophylaxis: heparin  TOTAL TIME TAKING CARE OF THIS PATIENT: 30 minutes.  >50% time spent on counselling and coordination of care  POSSIBLE D/C IN 1 DAYS, DEPENDING ON CLINICAL CONDITION.  Likely rehab with palliative care to follow with transition to hospice  Note: This dictation was prepared with Dragon dictation along with smaller phrase technology. Any transcriptional errors that result from this process are unintentional.  Delfino LovettVipul Avyay Coger M.D on 08/20/2017 at 4:49 PM  Between 7am to 6pm - Pager - [phone]  After 6pm go to [email or web address] - Social research officer, governmentpassword EPAS [email or web address]  [facility]  Office  [phone]  CC: Primary care physician; Lauro RegulusAnderson, Marshall W, MDPatient ID: Cassandra DanielMargie Beth, female   DOB: [birth year], 83 y.o.   MRN: [id number]

## 2017-08-20 NOTE — Clinical Social Work Note (Signed)
CSW presented bed offers to patient's daughter, she will review and let CSW know in the morning about where she would like patient to go.  CSW to continue to follow patient's progress througout discharge planning.  Ervin KnackEric R. Aiyla Baucom, MSW, Theresia MajorsLCSWA [phone]  08/20/2017 7:04 PM

## 2017-08-20 NOTE — Care Management Important Message (Signed)
Important Message  Patient Details  Name: Ladora DanielMargie Inscoe MRN: [id number] Date of Birth: 1929-07-05   Medicare Important Message Given:  Yes Signed IM notice given    Eber HongGreene, Ahan Eisenberger R, RN 08/20/2017, 2:35 PM

## 2017-08-20 NOTE — Progress Notes (Signed)
Pharmacy Antibiotic Follow-up Note  Cassandra Mccall is a 83 y.o. year-old female admitted on 08/15/2017.  The patient is currently on day 6 of abx for PNA.  Assessment/Plan: After discussion with Dr. Sherryll BurgerShah, will d/c abx tomorrow for a total of 7 days.   Temp (24hrs), Avg:98 F (36.7 C), Min:97.9 F (36.6 C), Max:98.1 F (36.7 C)  Recent Labs  Lab 08/15/17 0700 08/16/17 0454  WBC 12.4* 7.0    Recent Labs  Lab 08/16/17 0454 08/17/17 0622 08/18/17 0525 08/19/17 0547 08/20/17 0459  CREATININE 2.83* 2.72* 2.52* 2.56* 2.40*   Estimated Creatinine Clearance: 16.4 mL/min (A) (by C-G formula based on SCr of 2.4 mg/dL (H)).    Allergies  Allergen Reactions  . Sulfa Antibiotics     Thank you for allowing pharmacy to be a part of this patient's care.  Luisa Harthristy, Lulamae Skorupski D PharmD 08/20/2017 2:21 PM

## 2017-08-20 NOTE — Care Management (Signed)
Patient is for skilled nursing placement.  Bed search was initiated and has several bed offers.  Daughter is in agreement with this plan

## 2017-08-21 ENCOUNTER — Encounter
Admission: RE | Admit: 2017-08-21 | Discharge: 2017-08-21 | Disposition: A | Discharge status: Home / Self Care | Payer: Medicare Other | Source: Ambulatory Visit | Attending: Internal Medicine | Admitting: Internal Medicine

## 2017-08-21 LAB — BASIC METABOLIC PANEL
Anion gap: 7 (ref 5–15)
BUN: 65 mg/dL — ABNORMAL HIGH (ref 6–20)
CALCIUM: 8.4 mg/dL — AB (ref 8.9–10.3)
CO2: 24 mmol/L (ref 22–32)
Chloride: 107 mmol/L (ref 101–111)
Creatinine, Ser: 2.39 mg/dL — ABNORMAL HIGH (ref 0.44–1.00)
GFR, EST AFRICAN AMERICAN: 20 mL/min — AB (ref >60)
GFR, EST NON AFRICAN AMERICAN: 17 mL/min — AB (ref >60)
Glucose, Bld: 99 mg/dL (ref 65–99)
Potassium: 4.8 mmol/L (ref 3.5–5.1)
SODIUM: 138 mmol/L (ref 135–145)

## 2017-08-21 LAB — CBC
HCT: 33.3 % — ABNORMAL LOW (ref 35.0–47.0)
Hemoglobin: 10.9 g/dL — ABNORMAL LOW (ref 12.0–16.0)
MCH: 30.4 pg (ref 26.0–34.0)
MCHC: 32.8 g/dL (ref 32.0–36.0)
MCV: 92.7 fL (ref 80.0–100.0)
PLATELETS: 232 10*3/uL (ref 150–440)
RBC: 3.59 MIL/uL — ABNORMAL LOW (ref 3.80–5.20)
RDW: 13.7 % (ref 11.5–14.5)
WBC: 7.1 10*3/uL (ref 3.6–11.0)

## 2017-08-21 LAB — GLUCOSE, CAPILLARY
GLUCOSE-CAPILLARY: 92 mg/dL (ref 65–99)
GLUCOSE-CAPILLARY: 159 mg/dL — AB (ref 65–99)

## 2017-08-21 MED ORDER — GUAIFENESIN-DM 100-10 MG/5ML PO SYRP: 5.0000 mL | ORAL_SOLUTION | ORAL | 0 refills | Status: DC | PRN

## 2017-08-21 MED ORDER — ASPIRIN 81 MG PO TBEC: 81.0000 mg | DELAYED_RELEASE_TABLET | Freq: Every day | ORAL | 0 refills | Status: AC

## 2017-08-21 MED ORDER — ALPRAZOLAM 0.5 MG PO TABS: 0.5000 mg | ORAL_TABLET | Freq: Every day | ORAL | 0 refills | Status: DC

## 2017-08-21 MED ORDER — ATORVASTATIN CALCIUM 80 MG PO TABS: 80.0000 mg | ORAL_TABLET | Freq: Every day | ORAL | 0 refills | Status: DC

## 2017-08-21 NOTE — Discharge Summary (Signed)
[facility] - [place] at [facility]   PATIENT NAME: Cassandra Mccall    MR#:  [id number]  DATE OF BIRTH:  May 01, 1929  DATE OF ADMISSION:  08/15/2017   ADMITTING PHYSICIAN: Shaune PollackQing Chen, MD  DATE OF DISCHARGE: 08/21/2017  PRIMARY CARE PHYSICIAN: Lauro RegulusAnderson, Marshall W, MD   ADMISSION DIAGNOSIS:  Respiratory distress [R06.03] SOB (shortness of breath) [R06.02] Hypoxia [R09.02] Elevated troponin I level [R74.8] HCAP (healthcare-associated pneumonia) [J18.9] DISCHARGE DIAGNOSIS:  Active Problems:   Acute respiratory failure with hypoxia (HCC)   Elevated troponin I level   Respiratory distress   Alzheimer disease   Chronic kidney disease (CKD), stage IV (severe) (HCC)   Palliative care encounter  SECONDARY DIAGNOSIS:   Past Medical History:  Diagnosis Date  . Alzheimer disease   . CHF (congestive heart failure) (HCC)   . Chronic kidney disease   . COPD (chronic obstructive pulmonary disease) (HCC)   . Diabetes mellitus without complication (HCC)   . Hypertension   . PAD (peripheral artery disease) (HCC)   . PVD (peripheral vascular disease) [facility](HCC)    HOSPITAL COURSE:  Cassandra QuamMargie Prestonis a 84 y.o.femalebrought to the ED from home via EMS with a chief complaint of shortness of breath. EMS reports patient is on day 3 of Z-Pak for pneumonia. Family states patient slid down in the bed and the family had a very hard time getting her back up secondary to generalized weakness  1.Acuteonchronic respiratory failure hypoxia due to acute on chronic diastolic CHF with pleural effusion and PNA. - continue oxygen by nasal cannula - back to her baseline -uses oxygen at night and prn daytime chronically per family  2.Acute on chronic diastolic CHF with pleural effusion. Echo: EF 50% to 55%. Creat 2.39  3. pneumonia.  -finished abx course - Blood cultures- there is no bacteremia - coagulase-negative staph is contaminant -Robitussin as  needed  4.UTI. -treated  5. NSTEMI: Troponin peak at 11.17 - Continue aspirin and Plavix -cont statin. -deferred invasive coronary evaluation for now given renal insuffiency. This will need to be readdressed when renal function improvesper Dr. Lady GaryFath. -Please note patient also has 100% carotid blockage on the left and has significant vascular disease in her lower extremities and is followed by vascular surgery  6.Acute on chronic renal failure stage IV.  -good uop and creatinine trending down - 2.4 today  7.Hyperkalemia. Improved with Kayexalate.  8.Abnormal liver function tests. suspected due to CHF  9.COPD. Stable continue home nebulizer.  10.PAD. Continue aspirin and Plavix.  follows with vascular surgery  11.Diabetes: diet controlled DISCHARGE CONDITIONS:  stable CONSULTS OBTAINED:  Treatment Team:  Mosetta PigeonSingh, Harmeet, MD Dalia HeadingFath, Kenneth A, MD DRUG ALLERGIES:   Allergies  Allergen Reactions  . Sulfa Antibiotics    DISCHARGE MEDICATIONS:   Allergies as of 08/21/2017      Reactions   Sulfa Antibiotics       Medication List    TAKE these medications   ADVAIR HFA 230-21 MCG/ACT inhaler Generic drug:  fluticasone-salmeterol Inhale 2 puffs into the lungs every 12 (twelve) hours as needed.   ALPRAZolam 0.5 MG tablet Commonly known as:  XANAX Take 1 tablet (0.5 mg total) by mouth at bedtime. What changed:    medication strength  how much to take   amLODipine 2.5 MG tablet Commonly known as:  NORVASC Take 1 tablet by mouth daily.   aspirin 81 MG EC tablet Take 1 tablet (81 mg total) by mouth daily. Start taking on:  08/22/2017  atorvastatin 80 MG tablet Commonly known as:  LIPITOR Take 1 tablet (80 mg total) by mouth daily at 6 PM.   Bee Pollen 580 MG Caps Take 2 capsules by mouth 2 (two) times daily.   clopidogrel 75 MG tablet Commonly known as:  PLAVIX Take 1 tablet by mouth daily.   FISH OIL PO Take 1 capsule by mouth 2 (two) times  daily.   furosemide 20 MG tablet Commonly known as:  LASIX Take 1 tablet by mouth daily.   guaiFENesin 200 MG tablet Take 600 mg by mouth 2 (two) times daily.   guaiFENesin-dextromethorphan 100-10 MG/5ML syrup Commonly known as:  ROBITUSSIN DM Take 5 mLs by mouth every 4 (four) hours as needed for cough.   memantine 10 MG tablet Commonly known as:  NAMENDA Take 1 tablet by mouth 2 (two) times daily.   metoprolol succinate 25 MG 24 hr tablet Commonly known as:  TOPROL-XL Take 1 tablet by mouth daily.   mupirocin ointment 2 % Commonly known as:  BACTROBAN Apply 1 application topically 2 (two) times daily.   potassium chloride 10 MEQ tablet Commonly known as:  K-DUR Take 1 tablet by mouth daily as needed.   PROVENTIL HFA 108 (90 Base) MCG/ACT inhaler Generic drug:  albuterol Inhale 2 puffs into the lungs every 6 (six) hours as needed.   ranitidine 75 MG tablet Commonly known as:  ZANTAC Take 150 mg by mouth daily.   tiotropium 18 MCG inhalation capsule Commonly known as:  SPIRIVA Place 18 mcg into inhaler and inhale daily.   vitamin B-12 100 MCG tablet Commonly known as:  CYANOCOBALAMIN Take 100 mcg by mouth daily.        DISCHARGE INSTRUCTIONS:   DIET:  Cardiac diet DISCHARGE CONDITION:  Stable ACTIVITY:  Activity as tolerated OXYGEN:  Home Oxygen: Yes.    Oxygen Delivery: 2 liters/min via Patient connected to nasal cannula oxygen DISCHARGE LOCATION:  nursing home with palliative care to follow  If you experience worsening of your admission symptoms, develop shortness of breath, life threatening emergency, suicidal or homicidal thoughts you must seek medical attention immediately by calling 911 or calling your MD immediately  if symptoms less severe.  You Must read complete instructions/literature along with all the possible adverse reactions/side effects for all the Medicines you take and that have been prescribed to you. Take any new Medicines after  you have completely understood and accpet all the possible adverse reactions/side effects.   Please note  You were cared for by a hospitalist during your hospital stay. If you have any questions about your discharge medications or the care you received while you were in the hospital after you are discharged, you can call the unit and asked to speak with the hospitalist on call if the hospitalist that took care of you is not available. Once you are discharged, your primary care physician will handle any further medical issues. Please note that NO REFILLS for any discharge medications will be authorized once you are discharged, as it is imperative that you return to your primary care physician (or establish a relationship with a primary care physician if you do not have one) for your aftercare needs so that they can reassess your need for medications and monitor your lab values.    On the day of Discharge:  VITAL SIGNS:  Blood pressure (!) 136/53, pulse 67, temperature (!) 97.5 F (36.4 C), temperature source Oral, resp. rate 18, height 5\' 7"  (1.702 m), weight 69.3 kg (  152 lb 12.8 oz), SpO2 100 %. PHYSICAL EXAMINATION:  GENERAL:  84 y.o.-year-old patient lying in the bed with no acute distress.  EYES: Pupils equal, round, reactive to light and accommodation. No scleral icterus. Extraocular muscles intact.  HEENT: Head atraumatic, normocephalic. Oropharynx and nasopharynx clear.  NECK:  Supple, no jugular venous distention. No thyroid enlargement, no tenderness.  LUNGS: Normal breath sounds bilaterally, no wheezing, rales,rhonchi or crepitation. No use of accessory muscles of respiration.  CARDIOVASCULAR: S1, S2 normal. No murmurs, rubs, or gallops.  ABDOMEN: Soft, non-tender, non-distended. Bowel sounds present. No organomegaly or mass.  EXTREMITIES: No pedal edema, cyanosis, or clubbing.  NEUROLOGIC: Cranial nerves II through XII are intact. Muscle strength 5/5 in all extremities. Sensation  intact. Gait not checked.  PSYCHIATRIC: The patient is alert and oriented x 3.  SKIN: No obvious rash, lesion, or ulcer.  DATA REVIEW:   CBC Recent Labs  Lab 08/21/17 0514  WBC 7.1  HGB 10.9*  HCT 33.3*  PLT 232    Chemistries  Recent Labs  Lab 08/15/17 0700  08/21/17 0514  NA 137   < > 138  K 5.6*   < > 4.8  CL 107   < > 107  CO2 21*   < > 24  GLUCOSE 234*   < > 99  BUN 53*   < > 65*  CREATININE 2.48*   < > 2.39*  CALCIUM 8.9   < > 8.4*  AST 130*  --   --   ALT 102*  --   --   ALKPHOS 74  --   --   BILITOT 0.8  --   --    < > = values in this interval not displayed.      Contact information for follow-up providers    Lauro Regulus, MD. Schedule an appointment as soon as possible for a visit in 1 week(s).   Specialty:  Internal Medicine Contact information: [address] [facility] [place] [address] [phone]        Dalia Heading, MD. Schedule an appointment as soon as possible for a visit in 2 week(s).   Specialty:  Cardiology Contact information: [address] [facility] - CARDIOLOGY [place] [address] [phone]        Mosetta Pigeon, MD. Schedule an appointment as soon as possible for a visit in 3 week(s).   Specialty:  Internal Medicine Contact information: [address] [place] [address] [phone]            Contact information for after-discharge care    Destination    [facility] Follow up.   Service:  Skilled Nursing Contact information: [address] [place] [address] [phone]                  Overall very Poor prognosis - likely qualify for Hospice  Management plans discussed with the patient, family and they are in agreement.  CODE STATUS: DNR   TOTAL TIME TAKING CARE OF THIS PATIENT: 45 minutes.    Delfino Lovett M.D on 08/21/2017 at 11:31 AM  Between 7am to 6pm - Pager - [phone]  After  6pm go to [email or web address] - Scientist, research (life sciences) [facility]  Office  [phone]  CC: Primary care physician; Lauro Regulus, MD   Note: This dictation was prepared with Dragon dictation along with smaller phrase technology. Any transcriptional errors that result from  this process are unintentional.

## 2017-08-21 NOTE — Clinical Social Work Placement (Signed)
   CLINICAL SOCIAL WORK PLACEMENT  NOTE  Date:  08/21/2017  Patient Details  Name: Cassandra Mccall MRN: [id number] Date of Birth: Oct 09, 1928  Clinical Social Work is seeking post-discharge placement for this patient at the Skilled  Nursing Facility level of care (*CSW will initial, date and re-position this form in  chart as items are completed):  Yes   Patient/family provided with [place] Clinical Social Work Department's list of facilities offering this level of care within the geographic area requested by the patient (or if unable, by the patient's family).  Yes   Patient/family informed of their freedom to choose among providers that offer the needed level of care, that participate in Medicare, Medicaid or managed care program needed by the patient, have an available bed and are willing to accept the patient.  Yes   Patient/family informed of [facility]'s ownership interest in [facility] and [facility], as well as of the fact that they are under no obligation to receive care at these facilities.  PASRR submitted to EDS on 08/19/17     PASRR number received on 08/19/17     Existing PASRR number confirmed on       FL2 transmitted to all facilities in geographic area requested by pt/family on 08/19/17     FL2 transmitted to all facilities within larger geographic area on       Patient informed that his/her managed care company has contracts with or will negotiate with certain facilities, including the following:            Patient/family informed of bed offers received.  Patient chooses bed at       Physician recommends and patient chooses bed at      Patient to be transferred to   on  .  Patient to be transferred to facility by       Patient family notified on   of transfer.  Name of family member notified:        PHYSICIAN Please sign DNR     Additional Comment:    _______________________________________________ Darleene CleaverAnterhaus, Halley Shepheard R, LCSWA 08/21/2017,  1:47 PM

## 2017-08-21 NOTE — Clinical Social Work Note (Addendum)
Patient to be [facility] today [facility].  Patient and family agreeable to plans will transport via ems RN to call report [phone].  Palliative recommended that palliative follow patient at SNF, CSW notified hospice and palliative of [place] about referral.  Windell MouldingEric Aryn Safran, MSW, Theresia MajorsLCSWA [phone]

## 2017-08-21 NOTE — Progress Notes (Signed)
Ladora DanielMargie Poulson to be D/C'd Skilled nursing facility per MD order. Patient/daughter given discharge teaching and paperwork regarding medications, diet, follow-up appointments and activity. Patient understanding verbalized. No questions or complaints at this time. Skin condition as charted. IV and telemetry removed prior to leaving.  No further needs by Care Management/Social Work. Packet prepared by Sarita BottomEric SW.   An After Visit Summary was printed and given to the patient/daughter.   [facility]air [facility] and EMS called for transport.   Clayborne DanaBeck, Jarris Kortz B

## 2017-08-21 NOTE — Plan of Care (Signed)
  Progressing Activity: Risk for activity intolerance will decrease 08/21/2017 0220 - Progressing by Dorna LeitzNesbitt, Pawel Soules M, RN Pain Managment: General experience of comfort will improve 08/21/2017 0220 - Progressing by Dorna LeitzNesbitt, Tariana Moldovan M, RN Safety: Ability to remain free from injury will improve 08/21/2017 0220 - Progressing by Dorna LeitzNesbitt, Hayat Warbington M, RN Respiratory: Ability to maintain adequate ventilation will improve 08/21/2017 0220 - Progressing by Dorna LeitzNesbitt, Oddis Westling M, RN Ability to maintain a clear airway will improve 08/21/2017 0220 - Progressing by Dorna LeitzNesbitt, Noeh Sparacino M, RN

## 2017-08-21 NOTE — Discharge Instructions (Signed)
Heart Attack A heart attack (myocardial infarction, MI) causes damage to the heart that cannot be fixed. A heart attack often happens when a blood clot or other blockage cuts blood flow to the heart. When this happens, certain areas of the heart begin to die. This causes the pain you feel during a heart attack. Follow these instructions at home:  Take medicine as told by your doctor. You may need medicine to: ? Keep your blood from clotting too easily. ? Control your blood pressure. ? Lower your cholesterol. ? Control abnormal heart rhythms.  Change certain behaviors as told by your doctor. This may include: ? Quitting smoking. ? Being active. ? Eating a heart-healthy diet. Ask your doctor for help with this diet. ? Keeping a healthy weight. ? Keeping your diabetes under control. ? Lessening stress. ? Limiting how much alcohol you drink. Do not take these medicines unless your doctor says that you can:  Nonsteroidal anti-inflammatory drugs (NSAIDs). These include: ? Ibuprofen. ? Naproxen. ? Celecoxib.  Vitamin supplements that have vitamin A, vitamin E, or both.  Hormone therapy that contains estrogen with or without progestin.  Get help right away if:  You have sudden chest discomfort.  You have sudden discomfort in your: ? Arms. ? Back. ? Neck. ? Jaw.  You have shortness of breath at any time.  You have sudden sweating or clammy skin.  You feel sick to your stomach (nauseous) or throw up (vomit).  You suddenly get light-headed or dizzy.  You feel your heart beating fast or skipping beats. These symptoms may be an emergency. Do not wait to see if the symptoms will go away. Get medical help right away. Call your local emergency services (911 in the [place] Do not drive yourself to the hospital. This information is not intended to replace advice given to you by your health care provider. Make sure you discuss any questions you have with your health care  provider. Document Released: 02/06/2012 Document Revised: 01/13/2016 Document Reviewed: 10/10/2013 Elsevier Interactive Patient Education  2017 Elsevier Inc.  

## 2017-08-23 NOTE — Progress Notes (Signed)
New referral for out patient Palliative to follow [facility] received from CSW C.H. Robinson WorldwideEric Anterhaus.Patient discharged on 08/21/2017. Patient information faxed to referral. Dayna BarkerKaren Robertson RN, BSN, [facility], [facility] [phone]

## 2017-08-28 ENCOUNTER — Other Ambulatory Visit
Admission: RE | Admit: 2017-08-28 | Discharge: 2017-08-28 | Disposition: A | Discharge status: Home / Self Care | Payer: No Typology Code available for payment source | Source: Ambulatory Visit | Attending: Gerontology | Admitting: Gerontology

## 2017-08-28 DIAGNOSIS — R05 Cough: Secondary | ICD-10-CM | POA: Insufficient documentation

## 2017-08-28 LAB — VITAMIN B12: Vitamin B-12: 1275 pg/mL — ABNORMAL HIGH (ref 180–914)

## 2017-08-28 LAB — COMPREHENSIVE METABOLIC PANEL
ALK PHOS: 58 U/L (ref 38–126)
ALT: 25 U/L (ref 14–54)
AST: 19 U/L (ref 15–41)
Albumin: 2.8 g/dL — ABNORMAL LOW (ref 3.5–5.0)
Anion gap: 7 (ref 5–15)
BUN: 83 mg/dL — ABNORMAL HIGH (ref 6–20)
CALCIUM: 8.7 mg/dL — AB (ref 8.9–10.3)
CO2: 22 mmol/L (ref 22–32)
Chloride: 107 mmol/L (ref 101–111)
Creatinine, Ser: 2.56 mg/dL — ABNORMAL HIGH (ref 0.44–1.00)
GFR calc Af Amer: 18 mL/min — ABNORMAL LOW (ref >60)
GFR calc non Af Amer: 16 mL/min — ABNORMAL LOW (ref >60)
GLUCOSE: 200 mg/dL — AB (ref 65–99)
Potassium: 4.9 mmol/L (ref 3.5–5.1)
SODIUM: 136 mmol/L (ref 135–145)
Total Bilirubin: 0.5 mg/dL (ref 0.3–1.2)
Total Protein: 6.6 g/dL (ref 6.5–8.1)

## 2017-08-28 LAB — CBC WITH DIFFERENTIAL/PLATELET
BASOS PCT: 0 %
Basophils Absolute: 0 10*3/uL (ref 0–0.1)
EOS ABS: 0 10*3/uL (ref 0–0.7)
Eosinophils Relative: 0 %
HCT: 32.4 % — ABNORMAL LOW (ref 35.0–47.0)
HEMOGLOBIN: 10.7 g/dL — AB (ref 12.0–16.0)
LYMPHS ABS: 0.6 10*3/uL — AB (ref 1.0–3.6)
Lymphocytes Relative: 6 %
MCH: 30.4 pg (ref 26.0–34.0)
MCHC: 32.8 g/dL (ref 32.0–36.0)
MCV: 92.6 fL (ref 80.0–100.0)
Monocytes Absolute: 0.9 10*3/uL (ref 0.2–0.9)
Monocytes Relative: 9 %
NEUTROS ABS: 8.9 10*3/uL — AB (ref 1.4–6.5)
NEUTROS PCT: 85 %
Platelets: 251 10*3/uL (ref 150–440)
RBC: 3.5 MIL/uL — AB (ref 3.80–5.20)
RDW: 13.9 % (ref 11.5–14.5)
WBC: 10.4 10*3/uL (ref 3.6–11.0)

## 2017-08-28 LAB — MAGNESIUM: Magnesium: 2.4 mg/dL (ref 1.7–2.4)

## 2017-08-28 LAB — TSH: TSH: 1.206 u[IU]/mL (ref 0.350–4.500)

## 2017-08-29 ENCOUNTER — Non-Acute Institutional Stay (SKILLED_NURSING_FACILITY): Payer: Medicare Other | Admitting: Gerontology

## 2017-08-29 DIAGNOSIS — J189 Pneumonia, unspecified organism: Secondary | ICD-10-CM | POA: Diagnosis not present

## 2017-08-29 LAB — VITAMIN D 25 HYDROXY (VIT D DEFICIENCY, FRACTURES): Vit D, 25-Hydroxy: 26 ng/mL — ABNORMAL LOW (ref 30.0–100.0)

## 2017-08-31 ENCOUNTER — Encounter: Payer: Self-pay | Admitting: Gerontology

## 2017-08-31 ENCOUNTER — Ambulatory Visit: Payer: Medicare Other | Admitting: Family

## 2017-08-31 NOTE — Progress Notes (Signed)
[facility]:   [facility] Room Number: 208A Place of Service:  SNF [phone] Provider:  Lorenso QuarryShannon Daphney Hopke, NP-C  Lauro RegulusAnderson, Marshall W, MD  Patient Care Team: Lauro RegulusAnderson, Marshall W, MD as PCP - General (Internal Medicine)  Extended Emergency Contact Information Primary Emergency Contact: Gwynneth MunsonPollock,Karen  [place] Home Phone: [phone] Relation: Daughter Secondary Emergency Contact: Bailiff,David Work Phone: [phone] Mobile Phone: [phone] Relation: Son  Code Status:  DNR Goals of care: Advanced Directive information Advanced Directives 08/31/2017  Does Patient Have a Medical Advance Directive? Yes  Type of Estate agentAdvance Directive Healthcare Power of [place];Out of facility DNR (pink MOST or yellow form)  Does patient want to make changes to medical advance directive? No - Patient declined  Copy of Healthcare Power of Attorney in Chart? Yes  Pre-existing out of facility DNR order (yellow form or pink MOST form) -     Chief Complaint  Patient presents with  . Acute visit    Cough    HPI:  Pt is a 84 y.o. female seen today for acute visit for worsening cough (non-productive) and congestion with some shortness of breath. Pt is getting scheduled Duonebs already. Pt reports that overall, she doesn't well. Pt denies n/v/d/f/c/cp/ha/abd pain.dizziness/pain. Appetite is poor. She is pale. Intermittent confusion. On exam, pt has diminished breath sounds and generally appears ill. Will obtain CXR. No other complaints.         Past Medical History:  Diagnosis Date  . Alzheimer disease 12/27/2015  . CHF (congestive heart failure) (HCC)   . Chronic bronchitis (HCC) 12/27/2015  . Chronic kidney disease   . Congestive heart failure, NYHA class 1, chronic, diastolic (HCC) 12/27/2015  . Controlled type 2 diabetes mellitus without complication (HCC) 12/27/2015  . COPD (chronic obstructive pulmonary disease) (HCC)   . Depression, major, in remission  (HCC) 12/27/2015   on xanax prn  . Diabetes mellitus without complication (HCC)   . Factor V Leiden (HCC) 12/27/2015  . Hypertension   . Left-sided carotid artery disease (HCC) 12/27/2015   100% left sided, left so right  . PAD (peripheral artery disease) (HCC)   . PVD (peripheral vascular disease) (HCC) 12/27/2015   post stents in [place]   Past Surgical History:  Procedure Laterality Date  . BREAST EXCISIONAL BIOPSY    . TONSILLECTOMY    . VASCULAR SURGERY      Allergies  Allergen Reactions  . Statins     Other reaction(s): Muscle Pain Unclear if took  . Sulfa Antibiotics     Allergies as of 08/29/2017      Reactions   Sulfa Antibiotics       Medication List        Accurate as of 08/29/17 11:59 PM. Always use your most recent med list.          acetaminophen 325 MG tablet Commonly known as:  TYLENOL Take 650 mg by mouth every 4 (four) hours as needed.   ADVAIR HFA 230-21 MCG/ACT inhaler Generic drug:  fluticasone-salmeterol Inhale 2 puffs into the lungs every 12 (twelve) hours as needed.   ALPRAZolam 0.5 MG tablet Commonly known as:  XANAX Take 0.25 mg by mouth at bedtime as needed for anxiety. 1/2 tab   amLODipine 2.5 MG tablet Commonly known as:  NORVASC Take 1 tablet by mouth daily.   aspirin 81 MG EC tablet Take 1 tablet (81 mg total) by mouth daily.   atorvastatin 80 MG tablet Commonly known as:  LIPITOR  Take 1 tablet (80 mg total) by mouth daily at 6 PM.   clopidogrel 75 MG tablet Commonly known as:  PLAVIX Take 1 tablet by mouth daily.   DERMACLOUD Crea Apply liberal amount topically to area of skin irritation as needed. Ok to leave at bedside   FISH OIL PO Take 1 capsule by mouth 2 (two) times daily.   furosemide 20 MG tablet Commonly known as:  LASIX Take 1 tablet by mouth daily.   guaiFENesin 200 MG tablet Take 600 mg by mouth 2 (two) times daily.   ipratropium-albuterol 0.5-2.5 (3) MG/3ML Soln Commonly known as:   DUONEB Take 3 mLs by nebulization 3 (three) times daily.   memantine 10 MG tablet Commonly known as:  NAMENDA Take 1 tablet by mouth 2 (two) times daily.   metoprolol succinate 25 MG 24 hr tablet Commonly known as:  TOPROL-XL Take 1 tablet by mouth daily.   OXYGEN Inhale 2 L into the lungs continuous.   potassium chloride 10 MEQ tablet Commonly known as:  K-DUR Take 1 tablet by mouth daily as needed.   ranitidine 75 MG tablet Commonly known as:  ZANTAC Take 150 mg by mouth daily.   tiotropium 18 MCG inhalation capsule Commonly known as:  SPIRIVA Place 18 mcg into inhaler and inhale daily.   XOPENEX 1.25 MG/3ML nebulizer solution Generic drug:  levalbuterol Take 1.25 mg by nebulization every 6 (six) hours as needed for wheezing or shortness of breath.       Review of Systems  Constitutional: Negative for activity change, appetite change, chills, diaphoresis and fever.  HENT: Negative for congestion, mouth sores, nosebleeds, postnasal drip, sneezing, sore throat, trouble swallowing and voice change.   Respiratory: Positive for cough and shortness of breath. Negative for apnea, choking, chest tightness and wheezing.   Cardiovascular: Negative for chest pain, palpitations and leg swelling.  Gastrointestinal: Negative for abdominal distention, abdominal pain, constipation, diarrhea and nausea.  Genitourinary: Negative for difficulty urinating, dysuria, frequency and urgency.  Musculoskeletal: Negative for back pain, gait problem and myalgias. Arthralgias: typical arthritis.  Skin: Negative for color change, pallor, rash and wound.  Neurological: Negative for dizziness, tremors, syncope, speech difficulty, weakness, numbness and headaches.  Psychiatric/Behavioral: Negative for agitation and behavioral problems.  All other systems reviewed and are negative.   Immunization History  Administered Date(s) Administered  . Tdap 03/30/2017   There are no preventive care reminders  to display for this patient. No flowsheet data found. Functional Status Survey:    Vitals:   08/29/17 1458  BP: 132/60  Pulse: 77  Resp: 20  Temp: 97.9 F (36.6 C)  TempSrc: Oral  SpO2: 98%  Weight: 153 lb 8 oz (69.6 kg)  Height: 5\' 7"  (1.702 m)   Body mass index is 24.04 kg/m. Physical Exam  Constitutional: Vital signs are normal. She appears well-developed and well-nourished. She is active and cooperative. She appears ill. No distress. Nasal cannula in place.  HENT:  Head: Normocephalic and atraumatic.  Mouth/Throat: Uvula is midline, oropharynx is clear and moist and mucous membranes are normal. Mucous membranes are not pale, not dry and not cyanotic.  Eyes: Conjunctivae, EOM and lids are normal. Pupils are equal, round, and reactive to light.  Neck: Trachea normal, normal range of motion and full passive range of motion without pain. Neck supple. No JVD present. No tracheal deviation, no edema and no erythema present. No thyromegaly present.  Cardiovascular: Normal rate, normal heart sounds, intact distal pulses and normal pulses. An  irregular rhythm present. Exam reveals no gallop, no distant heart sounds and no friction rub.  No murmur heard. Pulses:      Dorsalis pedis pulses are 2+ on the right side, and 2+ on the left side.  No edema  Pulmonary/Chest: Effort normal. No accessory muscle usage. No respiratory distress. She has decreased breath sounds in the right upper field, the right middle field, the right lower field, the left upper field, the left middle field and the left lower field. She has no wheezes. She has no rhonchi. She has no rales. She exhibits no tenderness.  Abdominal: Soft. Normal appearance and bowel sounds are normal. She exhibits no distension and no ascites. There is no tenderness.  Musculoskeletal: Normal range of motion. She exhibits no edema or tenderness.  Expected osteoarthritis, stiffness; Bilateral Calves soft, supple. Negative Homan's Sign. B-  pedal pulses equal; generalized weakness; ambulate with walker with assistance  Neurological: She is alert. She has normal strength. She displays atrophy. A cranial nerve deficit and sensory deficit is present. She exhibits abnormal muscle tone. Coordination and gait abnormal.  Skin: Skin is warm, dry and intact. She is not diaphoretic. No cyanosis. No pallor. Nails show no clubbing.  Psychiatric: She has a normal mood and affect. Her speech is normal and behavior is normal. Judgment and thought content normal. Cognition and memory are impaired. She exhibits abnormal recent memory and abnormal remote memory.  Nursing note and vitals reviewed.   Labs reviewed: Recent Labs    08/20/17 0459 08/21/17 0514 08/28/17 0530  NA 140 138 136  K 4.8 4.8 4.9  CL 107 107 107  CO2 23 24 22   GLUCOSE 107* 99 200*  BUN 64* 65* 83*  CREATININE 2.40* 2.39* 2.56*  CALCIUM 8.3* 8.4* 8.7*  MG  --   --  2.4   Recent Labs    08/15/17 0700 08/28/17 0530  AST 130* 19  ALT 102* 25  ALKPHOS 74 58  BILITOT 0.8 0.5  PROT 8.3* 6.6  ALBUMIN 3.2* 2.8*   Recent Labs    08/15/17 0700 08/16/17 0454 08/21/17 0514 08/28/17 0530  WBC 12.4* 7.0 7.1 10.4  NEUTROABS 11.4*  --   --  8.9*  HGB 12.5 10.9* 10.9* 10.7*  HCT 39.5 33.2* 33.3* 32.4*  MCV 95.1 93.5 92.7 92.6  PLT 237 202 232 251   Lab Results  Component Value Date   TSH 1.206 08/28/2017   No results found for: HGBA1C No results found for: CHOL, HDL, LDLCALC, LDLDIRECT, TRIG, CHOLHDL  Significant Diagnostic Results in last 30 days:  Dg Chest Port 1 View  Result Date: 08/16/2017 CLINICAL DATA:  Respiratory failure EXAM: PORTABLE CHEST 1 VIEW COMPARISON:  08/15/2017 FINDINGS: Cardiac shadow is stable. Aortic calcifications are again seen. Bilateral pleural effusions are again noted and stable. Likely underlying infiltrate/atelectasis is present. No acute bony abnormality is seen. IMPRESSION: Stable appearance of the chest with bilateral pleural  effusions Electronically Signed   By: Alcide Clever M.D.   On: 08/16/2017 07:45   Dg Chest Port 1 View  Result Date: 08/15/2017 CLINICAL DATA:  Shortness of breath. EXAM: PORTABLE CHEST 1 VIEW COMPARISON:  Report of chest x-ray dated 08/12/2017 FINDINGS: There is a moderate right effusion and a small left effusion. Cardiomegaly. Pulmonary vascularity is within normal limits. No discrete pulmonary edema. Aortic atherosclerosis. No acute bone abnormality. IMPRESSION: Persistent bilateral effusions, right greater than left. Cardiomegaly. No discrete pulmonary edema. Electronically Signed   By: Francene Boyers M.D.  On: 08/15/2017 07:17    Assessment/Plan Pneumonia of both lungs due to infectious organism, unspecified part of lung  Levaquin 750 mg po x 1 (given last night at onset of symptoms)  Insert and maintain peripheral IV for infusion of IV antibiotics  Levaquin 500 mg IV EOD x 7 days for PNA  Continue scheduled Duonebs TID  Continue Spiriva 18 mcg Q Day  Continue Xopenex 1.25 mg Q 6 hrs prn  Continue Advair 230-21 2 puffs Q 12 hours  Encourage po fluid intake  Family/ staff Communication:   Total Time:  Documentation:  Face to Face:  Family/Phone:   Labs/tests ordered:  2 V CXR  Medication list reviewed and assessed for continued appropriateness. Monthly medication orders reviewed and signed.  Brynda Rim, NP-C Geriatrics [facility] [phone] [address], [place] [address] Cell Phone (Mon-Fri 8am-5pm):  [phone] On Call:  [phone] & follow prompts after 5pm & weekends Office Phone:  [phone] Office Fax:  [phone]

## 2017-09-04 ENCOUNTER — Ambulatory Visit: Payer: Medicare Other | Admitting: Family

## 2017-09-07 ENCOUNTER — Encounter: Payer: Self-pay | Admitting: Gerontology

## 2017-09-07 ENCOUNTER — Non-Acute Institutional Stay (SKILLED_NURSING_FACILITY): Payer: Medicare Other | Admitting: Gerontology

## 2017-09-07 DIAGNOSIS — J9601 Acute respiratory failure with hypoxia: Secondary | ICD-10-CM

## 2017-09-07 NOTE — Progress Notes (Signed)
Location:   [facility] Room Number: 208A Place of Service:  SNF [phone])  Provider: Lorenso Quarry, NP-C  PCP: Lauro Regulus, MD Patient Care Team: Lauro Regulus, MD as PCP - General (Internal Medicine)  Extended Emergency Contact Information Primary Emergency Contact: Gwynneth Munson of [place] Home Phone: [phone] Relation: Daughter Secondary Emergency Contact: Bailiff,David Work Phone: [phone] Mobile Phone: [phone] Relation: Son  Code Status: DNR Goals of care:  Advanced Directive information Advanced Directives 09/07/2017  Does Patient Have a Medical Advance Directive? Yes  Type of Estate agent of [place];Out of facility DNR (pink MOST or yellow form)  Does patient want to make changes to medical advance directive? No - Patient declined  Copy of Healthcare Power of Attorney in Chart? Yes  Pre-existing out of facility DNR order (yellow form or pink MOST form) -     Allergies  Allergen Reactions  . Statins     Other reaction(s): Muscle Pain Unclear if took  . Sulfa Antibiotics     Chief Complaint  Patient presents with  . Discharge Note    Discharged from SNF    HPI:  84 y.o. female seen today for discharge evaluation. Pt was admitted to the facility for rehab following hospitalization for Hypoxia/ Respiratory failure. Pt is on O2 2L [place]. Pt had been having worsening cough and congestion, dyspnea. She was found to have intractable Pneumonia. She was started on IV antibiotics, but when the IV got pulled out, pt refused to allow IV to be replaced. Medication changed to PO form. Pt seems to be tolerating this well. Today, pt reports she is feeling well. She is eating her lunch without difficulty. Voiding well and having regular BMs. She ambulates some with PT and OT. She denies pain. Denies chest pain or shortness of breath. She reports she is feeling better. Pt is scheduled to discharge  home with daughter with Hospice services. Patient was seen by Palliative medicine while in the facility as well as while in the hospital. VSS. No other complaints.        Past Medical History:  Diagnosis Date  . Alzheimer disease 12/27/2015  . CHF (congestive heart failure) (HCC)   . Chronic bronchitis (HCC) 12/27/2015  . Chronic kidney disease   . Congestive heart failure, NYHA class 1, chronic, diastolic (HCC) 12/27/2015  . Controlled type 2 diabetes mellitus without complication (HCC) 12/27/2015  . COPD (chronic obstructive pulmonary disease) (HCC)   . Depression, major, in remission (HCC) 12/27/2015   on xanax prn  . Diabetes mellitus without complication (HCC)   . Factor V Leiden (HCC) 12/27/2015  . Hypertension   . Left-sided carotid artery disease (HCC) 12/27/2015   100% left sided, left so right  . PAD (peripheral artery disease) (HCC)   . PVD (peripheral vascular disease) (HCC) 12/27/2015   post stents in [place]    Past Surgical History:  Procedure Laterality Date  . BREAST EXCISIONAL BIOPSY    . TONSILLECTOMY    . VASCULAR SURGERY        reports that she has quit smoking. Her smoking use included cigarettes. she has never used smokeless tobacco. She reports that she does not drink alcohol or use drugs. Social History   Socioeconomic History  . Marital status: Widowed    Spouse name: Not on file  . Number of children: 2  . Years of education: 62  . Highest education level: High school graduate  Social Needs  .  Financial resource strain: Not on file  . Food insecurity - worry: Not on file  . Food insecurity - inability: Not on file  . Transportation needs - medical: Not on file  . Transportation needs - non-medical: Not on file  Occupational History  . Not on file  Tobacco Use  . Smoking status: Former Smoker    Types: Cigarettes  . Smokeless tobacco: Never Used  Substance and Sexual Activity  . Alcohol use: No    Frequency: Never  . Drug use: No    . Sexual activity: Not Currently  Other Topics Concern  . Not on file  Social History Narrative   DNR with [facility]   Widowed   2 children   Former smoker   Alcohol - none   Smokeless tobacco - none   Functional Status Survey:    Allergies  Allergen Reactions  . Statins     Other reaction(s): Muscle Pain Unclear if took  . Sulfa Antibiotics     There are no preventive care reminders to display for this patient.  Medications: Allergies as of 09/07/2017      Reactions   Statins    Other reaction(s): Muscle Pain Unclear if took   Sulfa Antibiotics       Medication List        Accurate as of 09/07/17  9:08 AM. Always use your most recent med list.          acetaminophen 325 MG tablet Commonly known as:  TYLENOL Take 650 mg by mouth every 4 (four) hours as needed.   ADVAIR HFA 230-21 MCG/ACT inhaler Generic drug:  fluticasone-salmeterol Inhale 2 puffs into the lungs every 12 (twelve) hours as needed.   ALPRAZolam 0.5 MG tablet Commonly known as:  XANAX Take 0.25 mg by mouth at bedtime as needed for anxiety. 1/2 tab   amLODipine 2.5 MG tablet Commonly known as:  NORVASC Take 1 tablet by mouth daily.   aspirin 81 MG EC tablet Take 1 tablet (81 mg total) by mouth daily.   atorvastatin 80 MG tablet Commonly known as:  LIPITOR Take 1 tablet (80 mg total) by mouth daily at 6 PM.   Cholecalciferol 2000 units Caps Take 1 capsule by mouth daily.   clopidogrel 75 MG tablet Commonly known as:  PLAVIX Take 1 tablet by mouth daily.   DERMACLOUD Crea Apply liberal amount topically to area of skin irritation as needed. Ok to leave at bedside   Dextromethorphan-Guaifenesin 5-100 MG/5ML Syrp Take 5 mLs by mouth every 4 (four) hours as needed.   FISH OIL PO Take 1 capsule by mouth 2 (two) times daily.   furosemide 20 MG tablet Commonly known as:  LASIX Take 1 tablet by mouth daily.   guaiFENesin 200 MG tablet Take 600 mg by mouth 2 (two) times daily.    ipratropium-albuterol 0.5-2.5 (3) MG/3ML Soln Commonly known as:  DUONEB Take 3 mLs by nebulization 3 (three) times daily.   memantine 10 MG tablet Commonly known as:  NAMENDA Take 1 tablet by mouth 2 (two) times daily.   metoprolol succinate 25 MG 24 hr tablet Commonly known as:  TOPROL-XL Take 1 tablet by mouth daily.   OXYGEN Inhale 2 L into the lungs continuous.   potassium chloride 10 MEQ tablet Commonly known as:  K-DUR Take 1 tablet by mouth daily as needed.   ranitidine 75 MG tablet Commonly known as:  ZANTAC Take 150 mg by mouth daily.   sodium chloride 0.65 %  Soln nasal spray Commonly known as:  OCEAN Place 2 sprays into both nostrils as needed for congestion.   tiotropium 18 MCG inhalation capsule Commonly known as:  SPIRIVA Place 18 mcg into inhaler and inhale daily.   XOPENEX 1.25 MG/3ML nebulizer solution Generic drug:  levalbuterol Take 1.25 mg by nebulization every 6 (six) hours as needed for wheezing or shortness of breath.       Review of Systems  Vitals:   09/07/17 0850  BP: 127/63  Pulse: 64  Resp: 20  Temp: 98.8 F (37.1 C)  TempSrc: Oral  SpO2: 95%  Weight: 152 lb 1.6 oz (69 kg)  Height: 5\' 7"  (1.702 m)   Body mass index is 23.82 kg/m. Physical Exam  Labs reviewed: Basic Metabolic Panel: Recent Labs    08/20/17 0459 08/21/17 0514 08/28/17 0530  NA 140 138 136  K 4.8 4.8 4.9  CL 107 107 107  CO2 23 24 22   GLUCOSE 107* 99 200*  BUN 64* 65* 83*  CREATININE 2.40* 2.39* 2.56*  CALCIUM 8.3* 8.4* 8.7*  MG  --   --  2.4   Liver Function Tests: Recent Labs    08/15/17 0700 08/28/17 0530  AST 130* 19  ALT 102* 25  ALKPHOS 74 58  BILITOT 0.8 0.5  PROT 8.3* 6.6  ALBUMIN 3.2* 2.8*   No results for input(s): LIPASE, AMYLASE in the last 8760 hours. No results for input(s): AMMONIA in the last 8760 hours. CBC: Recent Labs    08/15/17 0700 08/16/17 0454 08/21/17 0514 08/28/17 0530  WBC 12.4* 7.0 7.1 10.4  NEUTROABS  11.4*  --   --  8.9*  HGB 12.5 10.9* 10.9* 10.7*  HCT 39.5 33.2* 33.3* 32.4*  MCV 95.1 93.5 92.7 92.6  PLT 237 202 232 251   Cardiac Enzymes: Recent Labs    08/15/17 1153 08/15/17 1651 08/16/17 0454  TROPONINI 6.25* 11.17* 7.67*   BNP: Invalid input(s): POCBNP CBG: Recent Labs    08/20/17 2033 08/21/17 0747 08/21/17 1148  GLUCAP 134* 92 159*    Procedures and Imaging Studies During Stay: Dg Chest Port 1 View  Result Date: 08/16/2017 CLINICAL DATA:  Respiratory failure EXAM: PORTABLE CHEST 1 VIEW COMPARISON:  08/15/2017 FINDINGS: Cardiac shadow is stable. Aortic calcifications are again seen. Bilateral pleural effusions are again noted and stable. Likely underlying infiltrate/atelectasis is present. No acute bony abnormality is seen. IMPRESSION: Stable appearance of the chest with bilateral pleural effusions Electronically Signed   By: Alcide Clever M.D.   On: 08/16/2017 07:45   Dg Chest Port 1 View  Result Date: 08/15/2017 CLINICAL DATA:  Shortness of breath. EXAM: PORTABLE CHEST 1 VIEW COMPARISON:  Report of chest x-ray dated 08/12/2017 FINDINGS: There is a moderate right effusion and a small left effusion. Cardiomegaly. Pulmonary vascularity is within normal limits. No discrete pulmonary edema. Aortic atherosclerosis. No acute bone abnormality. IMPRESSION: Persistent bilateral effusions, right greater than left. Cardiomegaly. No discrete pulmonary edema. Electronically Signed   By: Francene Boyers M.D.   On: 08/15/2017 07:17    Assessment/Plan:   Acute respiratory failure with hypoxia (HCC)  Continue coarse of antibiotics already ordered  Continue O2 2L [place]  Continue current medication regimen  Continue scheduled TID Duonebs  Continue Advair BID  Continue Spirivia Q Day  Continue Xopenex Q 6 hrs prn  Home with Hospice services  Follow up with PCP asap after discharge for continuity of care   Patient is being discharged with the following home health services:  Hosp[facility] services through [facility]   Patient is being discharged with the following durable medical equipment:O2, transport chair through Hospice    Patient has been advised to f/u with their PCP in 1-2 weeks to bring them up to date on their rehab stay.  Social services at facility was responsible for arranging this appointment.  Pt was provided with a 30 day supply of prescriptions for medications and refills must be obtained from their PCP.  For controlled substances, a more limited supply may be provided adequate until PCP appointment only.  Future labs/tests needed:    Family/ staff Communication:   Total Time:  Documentation:  Face to Face:  Family/Phone:  Brynda RimShannon H. Maryjean Corpening, NP-C Geriatrics [facility] [address] [place], [place] [id number] Cell Phone (Mon-Fri 8am-5pm):  [phone] On Call:  [phone] & follow prompts after 5pm & weekends Office Phone:  [phone] Office Fax:  [phone]

## 2017-09-27 ENCOUNTER — Inpatient Hospital Stay
Admission: EM | Admit: 2017-09-27 | Discharge: 2017-10-03 | DRG: 871 | Disposition: A | Discharge status: Skilled Nursing Facility | Payer: Medicare Other | Attending: Internal Medicine | Admitting: Internal Medicine

## 2017-09-27 ENCOUNTER — Emergency Department: Payer: Medicare Other

## 2017-09-27 ENCOUNTER — Encounter: Payer: Self-pay | Admitting: *Deleted

## 2017-09-27 ENCOUNTER — Other Ambulatory Visit: Payer: Self-pay

## 2017-09-27 ENCOUNTER — Inpatient Hospital Stay: Payer: Medicare Other

## 2017-09-27 DIAGNOSIS — J9622 Acute and chronic respiratory failure with hypercapnia: Secondary | ICD-10-CM | POA: Diagnosis present

## 2017-09-27 DIAGNOSIS — F0280 Dementia in other diseases classified elsewhere without behavioral disturbance: Secondary | ICD-10-CM | POA: Diagnosis present

## 2017-09-27 DIAGNOSIS — G309 Alzheimer's disease, unspecified: Secondary | ICD-10-CM | POA: Diagnosis present

## 2017-09-27 DIAGNOSIS — J189 Pneumonia, unspecified organism: Secondary | ICD-10-CM | POA: Diagnosis present

## 2017-09-27 DIAGNOSIS — E1151 Type 2 diabetes mellitus with diabetic peripheral angiopathy without gangrene: Secondary | ICD-10-CM | POA: Diagnosis present

## 2017-09-27 DIAGNOSIS — K5900 Constipation, unspecified: Secondary | ICD-10-CM | POA: Diagnosis present

## 2017-09-27 DIAGNOSIS — D6851 Activated protein C resistance: Secondary | ICD-10-CM | POA: Diagnosis present

## 2017-09-27 DIAGNOSIS — F325 Major depressive disorder, single episode, in full remission: Secondary | ICD-10-CM | POA: Diagnosis present

## 2017-09-27 DIAGNOSIS — I130 Hypertensive heart and chronic kidney disease with heart failure and stage 1 through stage 4 chronic kidney disease, or unspecified chronic kidney disease: Secondary | ICD-10-CM | POA: Diagnosis present

## 2017-09-27 DIAGNOSIS — E1122 Type 2 diabetes mellitus with diabetic chronic kidney disease: Secondary | ICD-10-CM | POA: Diagnosis present

## 2017-09-27 DIAGNOSIS — N184 Chronic kidney disease, stage 4 (severe): Secondary | ICD-10-CM | POA: Diagnosis present

## 2017-09-27 DIAGNOSIS — N183 Chronic kidney disease, stage 3 (moderate): Secondary | ICD-10-CM | POA: Diagnosis present

## 2017-09-27 DIAGNOSIS — R1115 Cyclical vomiting syndrome unrelated to migraine: Secondary | ICD-10-CM

## 2017-09-27 DIAGNOSIS — Z79899 Other long term (current) drug therapy: Secondary | ICD-10-CM | POA: Diagnosis not present

## 2017-09-27 DIAGNOSIS — I2510 Atherosclerotic heart disease of native coronary artery without angina pectoris: Secondary | ICD-10-CM | POA: Diagnosis present

## 2017-09-27 DIAGNOSIS — I5020 Unspecified systolic (congestive) heart failure: Secondary | ICD-10-CM | POA: Diagnosis not present

## 2017-09-27 DIAGNOSIS — Z66 Do not resuscitate: Secondary | ICD-10-CM | POA: Diagnosis present

## 2017-09-27 DIAGNOSIS — W19XXXA Unspecified fall, initial encounter: Secondary | ICD-10-CM

## 2017-09-27 DIAGNOSIS — Y95 Nosocomial condition: Secondary | ICD-10-CM | POA: Diagnosis present

## 2017-09-27 DIAGNOSIS — E872 Acidosis: Secondary | ICD-10-CM | POA: Diagnosis present

## 2017-09-27 DIAGNOSIS — A419 Sepsis, unspecified organism: Secondary | ICD-10-CM | POA: Diagnosis present

## 2017-09-27 DIAGNOSIS — I5043 Acute on chronic combined systolic (congestive) and diastolic (congestive) heart failure: Secondary | ICD-10-CM | POA: Diagnosis present

## 2017-09-27 DIAGNOSIS — R1110 Vomiting, unspecified: Secondary | ICD-10-CM

## 2017-09-27 DIAGNOSIS — J9621 Acute and chronic respiratory failure with hypoxia: Secondary | ICD-10-CM | POA: Diagnosis present

## 2017-09-27 DIAGNOSIS — Z87891 Personal history of nicotine dependence: Secondary | ICD-10-CM

## 2017-09-27 DIAGNOSIS — J440 Chronic obstructive pulmonary disease with acute lower respiratory infection: Secondary | ICD-10-CM | POA: Diagnosis present

## 2017-09-27 DIAGNOSIS — Z0189 Encounter for other specified special examinations: Secondary | ICD-10-CM

## 2017-09-27 HISTORY — DX: Diaphragmatic hernia without obstruction or gangrene: K44.9

## 2017-09-27 LAB — DIFFERENTIAL
BASOS ABS: 0 10*3/uL (ref 0–0.1)
BASOS PCT: 0 %
Eosinophils Absolute: 0 10*3/uL (ref 0–0.7)
Eosinophils Relative: 0 %
LYMPHS PCT: 5 %
Lymphs Abs: 0.6 10*3/uL — ABNORMAL LOW (ref 1.0–3.6)
Monocytes Absolute: 0.6 10*3/uL (ref 0.2–0.9)
Monocytes Relative: 5 %
NEUTROS ABS: 10.4 10*3/uL — AB (ref 1.4–6.5)
NEUTROS PCT: 90 %

## 2017-09-27 LAB — CBC
HCT: 33.2 % — ABNORMAL LOW (ref 35.0–47.0)
Hemoglobin: 10.7 g/dL — ABNORMAL LOW (ref 12.0–16.0)
MCH: 29.4 pg (ref 26.0–34.0)
MCHC: 32.3 g/dL (ref 32.0–36.0)
MCV: 91.1 fL (ref 80.0–100.0)
PLATELETS: 205 10*3/uL (ref 150–440)
RBC: 3.65 MIL/uL — AB (ref 3.80–5.20)
RDW: 14.3 % (ref 11.5–14.5)
WBC: 11.6 10*3/uL — AB (ref 3.6–11.0)

## 2017-09-27 LAB — BASIC METABOLIC PANEL
ANION GAP: 10 (ref 5–15)
BUN: 46 mg/dL — AB (ref 6–20)
CHLORIDE: 106 mmol/L (ref 101–111)
CO2: 23 mmol/L (ref 22–32)
Calcium: 8.6 mg/dL — ABNORMAL LOW (ref 8.9–10.3)
Creatinine, Ser: 2.33 mg/dL — ABNORMAL HIGH (ref 0.44–1.00)
GFR, EST AFRICAN AMERICAN: 21 mL/min — AB (ref >60)
GFR, EST NON AFRICAN AMERICAN: 18 mL/min — AB (ref >60)
Glucose, Bld: 206 mg/dL — ABNORMAL HIGH (ref 65–99)
POTASSIUM: 4.1 mmol/L (ref 3.5–5.1)
SODIUM: 139 mmol/L (ref 135–145)

## 2017-09-27 LAB — TROPONIN I: TROPONIN I: 0.05 ng/mL — AB (ref <0.03)

## 2017-09-27 LAB — MRSA PCR SCREENING: MRSA by PCR: NEGATIVE

## 2017-09-27 LAB — TSH: TSH: 1.426 u[IU]/mL (ref 0.350–4.500)

## 2017-09-27 LAB — BRAIN NATRIURETIC PEPTIDE: B NATRIURETIC PEPTIDE 5: 1069 pg/mL — AB (ref 0.0–100.0)

## 2017-09-27 MED ORDER — ATORVASTATIN CALCIUM 20 MG PO TABS
80.0000 mg | ORAL_TABLET | Freq: Every day | ORAL | Status: DC
  Administered 2017-09-27 – 2017-10-02 (×5): 80 mg via ORAL
  Filled 2017-09-27 (×5): qty 4

## 2017-09-27 MED ORDER — FUROSEMIDE 20 MG PO TABS
20.0000 mg | ORAL_TABLET | Freq: Every day | ORAL | Status: DC
  Administered 2017-09-27: 20 mg via ORAL
  Filled 2017-09-27: qty 1

## 2017-09-27 MED ORDER — SALINE SPRAY 0.65 % NA SOLN
2.0000 | NASAL | Status: DC | PRN
  Filled 2017-09-27: qty 44

## 2017-09-27 MED ORDER — MOMETASONE FURO-FORMOTEROL FUM 200-5 MCG/ACT IN AERO
2.0000 | INHALATION_SPRAY | Freq: Two times a day (BID) | RESPIRATORY_TRACT | Status: DC
  Administered 2017-09-27 – 2017-10-03 (×13): 2 via RESPIRATORY_TRACT
  Filled 2017-09-27 (×2): qty 8.8

## 2017-09-27 MED ORDER — ENSURE ENLIVE PO LIQD
237.0000 mL | Freq: Two times a day (BID) | ORAL | Status: DC
  Administered 2017-09-27 – 2017-10-02 (×3): 237 mL via ORAL

## 2017-09-27 MED ORDER — GUAIFENESIN ER 600 MG PO TB12
600.0000 mg | ORAL_TABLET | Freq: Two times a day (BID) | ORAL | Status: DC
  Administered 2017-09-27 – 2017-10-03 (×12): 600 mg via ORAL
  Filled 2017-09-27 (×12): qty 1

## 2017-09-27 MED ORDER — ONDANSETRON HCL 4 MG/2ML IJ SOLN
4.0000 mg | Freq: Four times a day (QID) | INTRAMUSCULAR | Status: DC | PRN
  Administered 2017-09-30: 4 mg via INTRAVENOUS
  Filled 2017-09-27: qty 2

## 2017-09-27 MED ORDER — TIOTROPIUM BROMIDE MONOHYDRATE 18 MCG IN CAPS
18.0000 ug | ORAL_CAPSULE | Freq: Every day | RESPIRATORY_TRACT | Status: DC
  Administered 2017-09-27 – 2017-10-03 (×7): 18 ug via RESPIRATORY_TRACT
  Filled 2017-09-27 (×2): qty 5

## 2017-09-27 MED ORDER — VITAMIN D 1000 UNITS PO TABS
2000.0000 [IU] | ORAL_TABLET | Freq: Every day | ORAL | Status: DC
  Administered 2017-09-27 – 2017-10-02 (×6): 2000 [IU] via ORAL
  Filled 2017-09-27 (×6): qty 2

## 2017-09-27 MED ORDER — ALBUTEROL SULFATE (2.5 MG/3ML) 0.083% IN NEBU
2.5000 mg | INHALATION_SOLUTION | Freq: Once | RESPIRATORY_TRACT | Status: AC
  Administered 2017-09-27: 2.5 mg via RESPIRATORY_TRACT

## 2017-09-27 MED ORDER — VANCOMYCIN HCL IN DEXTROSE 1-5 GM/200ML-% IV SOLN
1000.0000 mg | Freq: Once | INTRAVENOUS | Status: AC
  Administered 2017-09-27: 1000 mg via INTRAVENOUS
  Filled 2017-09-27: qty 200

## 2017-09-27 MED ORDER — FAMOTIDINE 20 MG PO TABS
20.0000 mg | ORAL_TABLET | Freq: Every day | ORAL | Status: DC
  Administered 2017-09-27 – 2017-10-03 (×7): 20 mg via ORAL
  Filled 2017-09-27 (×7): qty 1

## 2017-09-27 MED ORDER — FUROSEMIDE 10 MG/ML IJ SOLN
20.0000 mg | Freq: Every day | INTRAMUSCULAR | Status: DC
  Administered 2017-09-28 – 2017-09-30 (×3): 20 mg via INTRAVENOUS
  Filled 2017-09-27: qty 4
  Filled 2017-09-27: qty 2
  Filled 2017-09-27: qty 4
  Filled 2017-09-27: qty 2

## 2017-09-27 MED ORDER — ACETAMINOPHEN 325 MG PO TABS: 650.0000 mg | ORAL_TABLET | Freq: Four times a day (QID) | ORAL | Status: DC | PRN

## 2017-09-27 MED ORDER — ASPIRIN EC 81 MG PO TBEC
81.0000 mg | DELAYED_RELEASE_TABLET | Freq: Every day | ORAL | Status: DC
  Administered 2017-09-27 – 2017-10-03 (×7): 81 mg via ORAL
  Filled 2017-09-27 (×8): qty 1

## 2017-09-27 MED ORDER — MEMANTINE HCL 5 MG PO TABS
10.0000 mg | ORAL_TABLET | Freq: Two times a day (BID) | ORAL | Status: DC
  Administered 2017-09-27 – 2017-10-02 (×11): 10 mg via ORAL
  Filled 2017-09-27 (×9): qty 2
  Filled 2017-09-27: qty 1
  Filled 2017-09-27: qty 2
  Filled 2017-09-27 (×2): qty 1

## 2017-09-27 MED ORDER — LEVOFLOXACIN IN D5W 750 MG/150ML IV SOLN
750.0000 mg | Freq: Once | INTRAVENOUS | Status: AC
  Administered 2017-09-28: 750 mg via INTRAVENOUS
  Filled 2017-09-27: qty 150

## 2017-09-27 MED ORDER — GUAIFENESIN-DM 100-10 MG/5ML PO SYRP
5.0000 mL | ORAL_SOLUTION | ORAL | Status: DC | PRN
  Administered 2017-10-02: 12:00:00 5 mL via ORAL
  Filled 2017-09-27 (×2): qty 5

## 2017-09-27 MED ORDER — AMLODIPINE BESYLATE 5 MG PO TABS
2.5000 mg | ORAL_TABLET | Freq: Every day | ORAL | Status: DC
  Administered 2017-09-27 – 2017-10-03 (×7): 2.5 mg via ORAL
  Filled 2017-09-27 (×7): qty 1

## 2017-09-27 MED ORDER — CLOPIDOGREL BISULFATE 75 MG PO TABS
75.0000 mg | ORAL_TABLET | Freq: Every day | ORAL | Status: DC
  Administered 2017-09-27 – 2017-10-03 (×7): 75 mg via ORAL
  Filled 2017-09-27 (×7): qty 1

## 2017-09-27 MED ORDER — HEPARIN SODIUM (PORCINE) 5000 UNIT/ML IJ SOLN
5000.0000 [IU] | Freq: Three times a day (TID) | INTRAMUSCULAR | Status: DC
  Administered 2017-09-27 – 2017-10-03 (×19): 5000 [IU] via SUBCUTANEOUS
  Filled 2017-09-27 (×21): qty 1

## 2017-09-27 MED ORDER — DOCUSATE SODIUM 100 MG PO CAPS
100.0000 mg | ORAL_CAPSULE | Freq: Two times a day (BID) | ORAL | Status: DC
  Administered 2017-09-27 – 2017-10-02 (×11): 100 mg via ORAL
  Filled 2017-09-27 (×11): qty 1

## 2017-09-27 MED ORDER — ALPRAZOLAM 0.25 MG PO TABS
0.1250 mg | ORAL_TABLET | Freq: Three times a day (TID) | ORAL | Status: DC | PRN
  Administered 2017-09-28 – 2017-09-29 (×2): 0.25 mg via ORAL
  Filled 2017-09-27 (×2): qty 1

## 2017-09-27 MED ORDER — DEXTROSE 5 % IV SOLN
1.0000 g | Freq: Once | INTRAVENOUS | Status: AC
  Administered 2017-09-27: 1 g via INTRAVENOUS
  Filled 2017-09-27: qty 10

## 2017-09-27 MED ORDER — POTASSIUM CHLORIDE CRYS ER 10 MEQ PO TBCR
10.0000 meq | EXTENDED_RELEASE_TABLET | Freq: Every day | ORAL | Status: DC
  Administered 2017-09-27 – 2017-09-30 (×4): 10 meq via ORAL
  Filled 2017-09-27 (×4): qty 1

## 2017-09-27 MED ORDER — ALBUTEROL SULFATE (2.5 MG/3ML) 0.083% IN NEBU: 2.5000 mg | INHALATION_SOLUTION | RESPIRATORY_TRACT | Status: DC | PRN

## 2017-09-27 MED ORDER — DEXTROSE 5 % IV SOLN
1.0000 g | Freq: Two times a day (BID) | INTRAVENOUS | Status: DC
  Administered 2017-09-27 – 2017-09-30 (×6): 1 g via INTRAVENOUS
  Filled 2017-09-27 (×11): qty 1

## 2017-09-27 MED ORDER — METOPROLOL SUCCINATE ER 25 MG PO TB24
25.0000 mg | ORAL_TABLET | Freq: Every day | ORAL | Status: DC
  Administered 2017-09-27 – 2017-10-03 (×7): 25 mg via ORAL
  Filled 2017-09-27 (×7): qty 1

## 2017-09-27 MED ORDER — ADULT MULTIVITAMIN W/MINERALS CH
1.0000 | ORAL_TABLET | Freq: Every day | ORAL | Status: DC
  Administered 2017-09-27 – 2017-10-03 (×7): 1 via ORAL
  Filled 2017-09-27 (×7): qty 1

## 2017-09-27 MED ORDER — METHYLPREDNISOLONE SODIUM SUCC 125 MG IJ SOLR
125.0000 mg | Freq: Once | INTRAMUSCULAR | Status: AC
  Administered 2017-09-27: 125 mg via INTRAVENOUS

## 2017-09-27 MED ORDER — LEVOFLOXACIN IN D5W 500 MG/100ML IV SOLN
500.0000 mg | INTRAVENOUS | Status: DC
  Administered 2017-09-30 – 2017-10-02 (×2): 500 mg via INTRAVENOUS
  Filled 2017-09-27 (×3): qty 100

## 2017-09-27 MED ORDER — AZITHROMYCIN 500 MG IV SOLR
500.0000 mg | Freq: Once | INTRAVENOUS | Status: AC
  Administered 2017-09-27: 500 mg via INTRAVENOUS
  Filled 2017-09-27: qty 500

## 2017-09-27 MED ORDER — ONDANSETRON HCL 4 MG PO TABS
4.0000 mg | ORAL_TABLET | Freq: Four times a day (QID) | ORAL | Status: DC | PRN
Start: 2017-09-27 — End: 2017-10-03

## 2017-09-27 MED ORDER — ACETAMINOPHEN 650 MG RE SUPP: 650.0000 mg | Freq: Four times a day (QID) | RECTAL | Status: DC | PRN

## 2017-09-27 MED ORDER — DEXTROSE 5 % IV SOLN: 1.0000 g | Freq: Three times a day (TID) | INTRAVENOUS | Status: DC

## 2017-09-27 MED ORDER — SODIUM CHLORIDE 0.9 % IV SOLN
INTRAVENOUS | Status: DC
  Administered 2017-09-27: 06:00:00 via INTRAVENOUS

## 2017-09-27 MED ORDER — VANCOMYCIN HCL IN DEXTROSE 1-5 GM/200ML-% IV SOLN
1000.0000 mg | INTRAVENOUS | Status: DC
  Administered 2017-09-27: 1000 mg via INTRAVENOUS
  Filled 2017-09-27 (×2): qty 200

## 2017-09-27 NOTE — Progress Notes (Signed)
Pharmacy Antibiotic Note  Cassandra Mccall is a 84 y.o. female admitted on 09/27/2017 with pneumonia.  Pharmacy has been consulted for vancomycin and Levaquin dosing.  Plan: DW 69kg  Vd 48L [place] 0.019 hr-1  T1/2 36 hours Vancomycin 1 gram q 48 hours ordered with stacked dosing. Level before 3rd dose. Goal trough 15-20.  Levaquin 750 mg x1 followed by 500 mg q 48 hours ordered.  Height: 5\' 7"  (170.2 cm) Weight: 152 lb (68.9 kg) IBW/kg (Calculated) : 61.6  Temp (24hrs), Avg:98.1 F (36.7 C), Min:98 F (36.7 C), Max:98.1 F (36.7 C)  Recent Labs  Lab 09/27/17 0237  WBC 11.6*  CREATININE 2.33*    Estimated Creatinine Clearance: 16.9 mL/min (A) (by C-G formula based on SCr of 2.33 mg/dL (H)).    Allergies  Allergen Reactions  . Statins     Other reaction(s): Muscle Pain Unclear if took  . Sulfa Antibiotics     Antimicrobials this admission: Levaquin, Cefepime, vancomycin 2/7  >>    >>   Dose adjustments this admission:   Microbiology results: 2/7 Sputum: pending  08/15/17 MRSA PCR: (-)      2/7 CXR: atelectasis vs. pneumonia Thank you for allowing pharmacy to be a part of this patient's care.  Samaira Holzworth S 09/27/2017 6:29 AM

## 2017-09-27 NOTE — Progress Notes (Signed)
Admitted this morning shortness of breath, acute respiratory failure with oxygen saturation 87% on room air, chest x-ray showed possible CHF, infiltrates admitted for acute respiratory failure with hypoxia secondary to pneumonia, CHF.  Patient has dementia.  She told me that she had a fall at home and also she knows that she is in the hospital but she does not know why.  She does not appear to be in distress. Lab data, medications reviewed. Assessment and plan: Healthcare associated pneumonia: Patient has no wheezing, continue vancomycin, cefepime. 2.  Acute on chronic systolic heart failure: Not controlled CHF, received Lasix continue small dose Lasix daily. 3.  Patient had a fall at home so x-rays of the hip and sacrum are negative for fractures. #4 CKD stage IV: Stable 5.  Sepsis present on admission with leukocytosis, tachycardia, tachypnea follow blood cultures, continue IV antibiotics.  No further fever. CODE STATUS DNR.

## 2017-09-27 NOTE — ED Triage Notes (Signed)
Pt brought in via ems from home with breathing difficulty.   Pt has dementia.  ptis on home oxygen at 2 liters.  Pt alert   md at bedside

## 2017-09-27 NOTE — ED Notes (Signed)
Report off to lorrie rn  

## 2017-09-27 NOTE — ED Provider Notes (Signed)
[facility] Emergency Department Provider Note    First MD Initiated Contact with Patient 09/27/17 [phone]     (approximate)  I have reviewed the triage vital signs and the nursing notes.  Level 5 caveat: History limited secondary to Alzheimer's dementia HISTORY  Chief Complaint Respiratory Distress    HPI Cassandra Mccall is a 84 y.o. female with below list of chronic medical conditions including Alzheimer's dementia, COPD and peripheral vascular disease presents to the emergency department via EMS with dyspnea noted tonight by the patient's daughter.  Per EMS patient is normally on 2 L oxygen via nasal cannula and was ambulating without that and her walker.  EMS states that when the daughter responded to her mother she was satting 75% when 2 L oxygen was applied.  EMS states on their arrival patient was satting 80%.  EMS states that the patient's daughter administered 2 DuoNeb's before their arrival.  Past Medical History:  Diagnosis Date  . Alzheimer disease 12/27/2015  . CHF (congestive heart failure) (HCC)   . Chronic bronchitis (HCC) 12/27/2015  . Chronic kidney disease   . Congestive heart failure, NYHA class 1, chronic, diastolic (HCC) 12/27/2015  . Controlled type 2 diabetes mellitus without complication (HCC) 12/27/2015  . COPD (chronic obstructive pulmonary disease) (HCC)   . Depression, major, in remission (HCC) 12/27/2015   on xanax prn  . Diabetes mellitus without complication (HCC)   . Factor V Leiden (HCC) 12/27/2015  . Hypertension   . Left-sided carotid artery disease (HCC) 12/27/2015   100% left sided, left so right  . PAD (peripheral artery disease) (HCC)   . PVD (peripheral vascular disease) (HCC) 12/27/2015   post stents in [place]    Patient Active Problem List   Diagnosis Date Noted  . Elevated troponin I level   . Respiratory distress   . Alzheimer disease   . Chronic kidney disease (CKD), stage IV (severe) (HCC)   .  Palliative care encounter   . Acute respiratory failure with hypoxia (HCC) 08/15/2017    Past Surgical History:  Procedure Laterality Date  . BREAST EXCISIONAL BIOPSY    . TONSILLECTOMY    . VASCULAR SURGERY      Prior to Admission medications   Medication Sig Start Date End Date Taking? Authorizing Provider  acetaminophen (TYLENOL) 325 MG tablet Take 650 mg by mouth every 4 (four) hours as needed.   Yes [provider]  albuterol (PROVENTIL HFA;VENTOLIN HFA) 108 (90 Base) MCG/ACT inhaler Inhale 2 puffs into the lungs every 6 (six) hours as needed for wheezing or shortness of breath.   Yes [provider]  ALPRAZolam (XANAX) 0.25 MG tablet Take 0.125-0.25 mg by mouth 3 (three) times daily as needed for anxiety or sleep. 1/2 tab    Yes [provider]  amLODipine (NORVASC) 2.5 MG tablet Take 1 tablet by mouth daily.  07/05/17  Yes [provider]  aspirin EC 81 MG EC tablet Take 1 tablet (81 mg total) by mouth daily. 08/22/17  Yes Delfino Lovett, MD  atorvastatin (LIPITOR) 80 MG tablet Take 1 tablet (80 mg total) by mouth daily at 6 PM. 08/21/17  Yes Delfino Lovett, MD  Cholecalciferol 2000 units CAPS Take 1 capsule by mouth daily.   Yes [provider]  clopidogrel (PLAVIX) 75 MG tablet Take 1 tablet by mouth daily.   Yes [provider]  fluticasone-salmeterol (ADVAIR HFA) 230-21 MCG/ACT inhaler Inhale 2 puffs into the lungs every 12 (twelve) hours  as needed. 08/08/17 08/08/18 Yes [provider]  furosemide (LASIX) 20 MG tablet Take 1-2 tablets by mouth daily.  08/09/17  Yes [provider]  guaiFENesin 200 MG tablet Take 600 mg by mouth 2 (two) times daily.    Yes [provider]  ipratropium-albuterol (DUONEB) 0.5-2.5 (3) MG/3ML SOLN Take 3 mLs by nebulization 3 (three) times daily.   Yes [provider]  levalbuterol (XOPENEX) 1.25 MG/3ML nebulizer solution Take 1.25 mg by nebulization every 6 (six) hours as  needed for wheezing or shortness of breath.   Yes [provider]  memantine (NAMENDA) 10 MG tablet Take 1 tablet by mouth 2 (two) times daily. 02/19/17  Yes [provider]  metoprolol succinate (TOPROL-XL) 25 MG 24 hr tablet Take 1 tablet by mouth daily. 07/05/17  Yes [provider]  Omega-3 Fatty Acids (FISH OIL PO) Take 1 capsule by mouth 2 (two) times daily.   Yes [provider]  potassium chloride (K-DUR) 10 MEQ tablet Take 1 tablet by mouth daily as needed. Daughter gives when pt takes 2nd furosemide tablet 12/11/16  Yes [provider]  ranitidine (ZANTAC) 75 MG tablet Take 150 mg by mouth daily.    Yes [provider]  sodium chloride (OCEAN) 0.65 % SOLN nasal spray Place 2 sprays into both nostrils as needed for congestion.   Yes [provider]  tiotropium (SPIRIVA) 18 MCG inhalation capsule Place 18 mcg into inhaler and inhale daily.   Yes [provider]  Dextromethorphan-Guaifenesin 5-100 MG/5ML SYRP Take 5 mLs by mouth every 4 (four) hours as needed.    [provider]  Infant Care Products [facility]) CREA Apply liberal amount topically to area of skin irritation as needed. Ok to leave at bedside    [provider]  OXYGEN Inhale 2 L into the lungs continuous.    [provider]    Allergies Statins and Sulfa antibiotics  Family History  Problem Relation Age of Onset  . COPD Mother   . Heart failure Mother   . Alzheimer's disease Father   . COPD Sister     Social History Social History   Tobacco Use  . Smoking status: Former Smoker    Types: Cigarettes  . Smokeless tobacco: Never Used  Substance Use Topics  . Alcohol use: No    Frequency: Never  . Drug use: No    Review of Systems Constitutional: No fever/chills Eyes: No visual changes. ENT: No sore throat. Cardiovascular: Denies chest pain. Respiratory: Positive for dyspnea Gastrointestinal: No abdominal pain.   No nausea, no vomiting.  No diarrhea.  No constipation. Genitourinary: Negative for dysuria. Musculoskeletal: Negative for neck pain.  Negative for back pain. Integumentary: Negative for rash. Neurological: Negative for headaches, focal weakness or numbness.   ____________________________________________   PHYSICAL EXAM:  VITAL SIGNS: ED Triage Vitals  Enc Vitals Group     BP      Pulse      Resp      Temp      Temp src      SpO2      Weight      Height      Head Circumference      Peak Flow      Pain Score      Pain Loc      Pain Edu?      Excl. in GC?     Constitutional: Alert with  Apparent respiratory difficulty  eyes: Conjunctivae are  normal.  Head: Atraumatic. Mouth/Throat: Mucous membranes are moist.  Oropharynx non-erythematous. Neck: No stridor.   Cardiovascular: Tachycardia, regular rhythm. Good peripheral circulation. Grossly normal heart sounds. Respiratory: Normal respiratory effort.  No retractions.  Bibasilar rhonchi  gastrointestinal: Soft and nontender. No distention.  Musculoskeletal: No lower extremity tenderness nor edema. No gross deformities of extremities. Neurologic:  Normal speech and language. No gross focal neurologic deficits are appreciated.  Skin:  Skin is warm, dry and intact. No rash noted. Psychiatric: Mood and affect are normal. Speech and behavior are normal.  ____________________________________________   LABS (all labs ordered are listed, but only abnormal results are displayed)  Labs Reviewed  CBC - Abnormal; Notable for the following components:      Result Value   WBC 11.6 (*)    RBC 3.65 (*)    Hemoglobin 10.7 (*)    HCT 33.2 (*)    All other components within normal limits  DIFFERENTIAL - Abnormal; Notable for the following components:   Neutro Abs 10.4 (*)    Lymphs Abs 0.6 (*)    All other components within normal limits  BASIC METABOLIC PANEL - Abnormal; Notable for the following components:   Glucose, Bld 206  (*)    BUN 46 (*)    Creatinine, Ser 2.33 (*)    Calcium 8.6 (*)    GFR calc non Af Amer 18 (*)    GFR calc Af Amer 21 (*)    All other components within normal limits  TROPONIN I - Abnormal; Notable for the following components:   Troponin I 0.05 (*)    All other components within normal limits  BRAIN NATRIURETIC PEPTIDE - Abnormal; Notable for the following components:   B Natriuretic Peptide 1,069.0 (*)    All other components within normal limits  CBC WITH DIFFERENTIAL/PLATELET   ____________________________________________  EKG  ED ECG REPORT I, [place] Aine Strycharz, the attending physician, personally viewed and interpreted this ECG.   Date: 09/27/2017  EKG Time: 1:03 AM  Rate: 102  Rhythm: Sinus tachycardia  Axis: Normal  Intervals: Normal  ST&T Change: None  ____________________________________________  RADIOLOGY I, Zay[place] N Konstantine Gervasi, personally viewed and evaluated these images (plain radiographs) as part of my medical decision making, as well as reviewing the written report by the radiologist.  ED MD interpretation: Concern for bibasilar pneumonia worse on the right.  Official radiology report(s): Dg Chest 2 View  Result Date: 09/27/2017 CLINICAL DATA:  Breathing difficulty EXAM: CHEST  2 VIEW COMPARISON:  08/16/2017 FINDINGS: Small bilateral pleural effusions. Cardiomegaly with vascular congestion and diffuse interstitial edema. Possible superimposed pneumonia at the bases. Aortic atherosclerosis. No pneumothorax. IMPRESSION: 1. Similar appearance of small bilateral effusions and bibasilar atelectasis or pneumonia 2. Continued cardiomegaly with vascular congestion and diffuse interstitial prominence compatible with mild edema. Electronically Signed   By: Jasmine PangKim  Fujinaga M.D.   On: 09/27/2017 01:39    ____________________________________________   PROCEDURES  Critical Care performed: CRITICAL CARE Performed by: Darci CurrentANDOLPH N Adalind Weitz   Total critical care time: 30  minutes  Critical care time was exclusive of separately billable procedures and treating other patients.  Critical care was necessary to treat or prevent imminent or life-threatening deterioration.  Critical care was time spent personally by me on the following activities: development of treatment plan with patient and/or surrogate as well as nursing, discussions with consultants, evaluation of patient's response to treatment, examination of patient, obtaining history from patient or surrogate, ordering and performing treatments and interventions, ordering and  review of laboratory studies, ordering and review of radiographic studies, pulse oximetry and re-evaluation of patient's condition.  Procedures   ____________________________________________   INITIAL IMPRESSION / ASSESSMENT AND PLAN / ED COURSE  As part of my medical decision making, I reviewed the following data within the electronic MEDICAL RECORD [id number] year old female present with above-stated history and physical exam secondary to respiratory difficulty.  Bibasilar rhonchi noted on auscultation.  Concern for pneumonia versus pulmonary edema and as such chest x-ray was performed which is consistent with pneumonia ____________________________________________  FINAL CLINICAL IMPRESSION(S) / ED DIAGNOSES  Final diagnoses:  Community acquired pneumonia, unspecified laterality     MEDICATIONS GIVEN DURING THIS VISIT:  Medications  azithromycin (ZITHROMAX) 500 mg in dextrose 5 % 250 mL IVPB (500 mg Intravenous New Bag/Given 09/27/17 0312)  albuterol (PROVENTIL) (2.5 MG/3ML) 0.083% nebulizer solution 2.5 mg (2.5 mg Nebulization Given 09/27/17 0114)  albuterol (PROVENTIL) (2.5 MG/3ML) 0.083% nebulizer solution 2.5 mg (2.5 mg Nebulization Given 09/27/17 0114)  methylPREDNISolone sodium succinate (SOLU-MEDROL) 125 mg/2 mL injection 125 mg (125 mg Intravenous Given 09/27/17 0114)  cefTRIAXone (ROCEPHIN) 1 g in dextrose 5 % 50 mL IVPB (0 g  Intravenous Stopped 09/27/17 0300)     ED Discharge Orders    None       Note:  This document was prepared using Dragon voice recognition software and may include unintentional dictation errors.    Darci Current, MD 09/27/17 [phone]

## 2017-09-27 NOTE — Progress Notes (Signed)
Initial Nutrition Assessment  DOCUMENTATION CODES:   Not applicable  INTERVENTION:   Ensure Enlive po BID, each supplement provides 350 kcal and 20 grams of protein  MVI daily  Liberalize diet  NUTRITION DIAGNOSIS:   Inadequate oral intake related to acute illness as evidenced by per patient/family report.  GOAL:   Patient will meet greater than or equal to 90% of their needs  MONITOR:   PO intake, Supplement acceptance, Weight trends, Labs, I & O's, Skin  REASON FOR ASSESSMENT:   Malnutrition Screening Tool    ASSESSMENT:   84 y/o female with h/o COPD, CHF, dementia, DM, CKD IV, admitted for PNA   Met with pt in room today. Pt is a poor historian but reports poor appetite and oral intake for several days pta. Pt ate 80% of her breakfast today. Pt reports that she does drink vanilla Ensure but not regularly. Per chart, pt is weight stable. RD will add Ensure to help pt meet her estimated needs and liberalize diet.    Medications reviewed and include: aspirin, vit D, plavix, colace, pepcid, heparin, KCl, NaCl @100ml /hr, cefepime   Labs reviewed: K 4.1 wnl, BUN 46(H), creat 2.33(H), Ca 8.6(L) BNP- 1069(H) Vitamin D, 25 hydroxy 26.0(L) B12- 1275(H) Wbc- 11.6(H), Hgb 10.7(L), Hct 33.2(L) BG 206  Nutrition-Focused physical exam completed. Findings are no fat depletion, no muscle depletion, and mild edema. Pt noted to have bruising on bilateral arms; reports recent fall.   Diet Order:  Diet regular Room service appropriate? Yes; Fluid consistency: Thin  EDUCATION NEEDS:   Education needs have been addressed  Skin:  Reviewed RN Assessment  Last BM:  2/5  Height:   Ht Readings from Last 1 Encounters:  09/27/17 5' 7"  (1.702 m)    Weight:   Wt Readings from Last 1 Encounters:  09/27/17 152 lb (68.9 kg)    Ideal Body Weight:  61.36 kg  BMI:  Body mass index is 23.81 kg/m.  Estimated Nutritional Needs:   Kcal:  1400-1600kcal/day   Protein:   69-83g/day   Fluid:  per MD  Koleen Distance MS, RD, LDN Pager #[phone] After Hours Pager: [phone]

## 2017-09-27 NOTE — Progress Notes (Signed)
[facility]  Patient Status: Active  [facility] is providing the following services: SN/PT/OT/HHA  If patient discharges after hours, please call [phone].   Cassandra GerlachJason E Mccall 09/27/2017, 8:40 AM

## 2017-09-27 NOTE — ED Notes (Signed)
Date and time results received: 09/27/17  Test: troponin  Critical Value: 0.05  Name of Provider Notified: Manson PasseyBrown  Orders Received? Or Actions Taken?: MD notified

## 2017-09-27 NOTE — H&P (Signed)
Cassandra Mccall is an 84 y.o. female.   Chief Complaint: Shortness of breath HPI: The patient with past medical history of CHF, COPD, hypertension diabetes and chronic kidney disease presents to the emergency department with shortness of breath.  The patient's daughter is her primary caretaker and had noticed that the patient seemed to have some increased work of breathing.  She states that her mother has declined since discharge from the rehab center following her hospital admission 2 months ago.  The patient's oxygen was originally at 87% on room air.  Chest x-ray in the emergency department showed some interstitial edema but was more concerning for infiltrate which prompted the emergency department staff to call the hospitalist service for further management.  Past Medical History:  Diagnosis Date  . Alzheimer disease 12/27/2015  . CHF (congestive heart failure) ([place])   . Chronic bronchitis ([place]) 12/27/2015  . Chronic kidney disease   . Congestive heart failure, NYHA class 1, chronic, diastolic ([place]) 96/29/5284  . Controlled type 2 diabetes mellitus without complication ([place]) 13/24/4010  . COPD (chronic obstructive pulmonary disease) ([place])   . Depression, major, in remission ([place]) 12/27/2015   on xanax prn  . Diabetes mellitus without complication ([place])   . Factor V Leiden ([place]) 12/27/2015  . Hypertension   . Left-sided carotid artery disease ([place]) 12/27/2015   100% left sided, left so right  . PAD (peripheral artery disease) ([place])   . PVD (peripheral vascular disease) ([place]) 12/27/2015   post stents in [place]    Past Surgical History:  Procedure Laterality Date  . BREAST EXCISIONAL BIOPSY    . TONSILLECTOMY    . VASCULAR SURGERY      Family History  Problem Relation Age of Onset  . COPD Mother   . Heart failure Mother   . Alzheimer's disease Father   . COPD Sister    Social History:  reports that she has quit smoking. Her smoking use included cigarettes. she has never  used smokeless tobacco. She reports that she does not drink alcohol or use drugs.  Allergies:  Allergies  Allergen Reactions  . Statins     Other reaction(s): Muscle Pain Unclear if took  . Sulfa Antibiotics     Medications Prior to Admission  Medication Sig Dispense Refill  . acetaminophen (TYLENOL) 325 MG tablet Take 650 mg by mouth every 4 (four) hours as needed.    Marland Kitchen albuterol (PROVENTIL HFA;VENTOLIN HFA) 108 (90 Base) MCG/ACT inhaler Inhale 2 puffs into the lungs every 6 (six) hours as needed for wheezing or shortness of breath.    . ALPRAZolam (XANAX) 0.25 MG tablet Take 0.125-0.25 mg by mouth 3 (three) times daily as needed for anxiety or sleep. 1/2 tab     . amLODipine (NORVASC) 2.5 MG tablet Take 1 tablet by mouth daily.     Marland Kitchen aspirin EC 81 MG EC tablet Take 1 tablet (81 mg total) by mouth daily. 30 tablet 0  . atorvastatin (LIPITOR) 80 MG tablet Take 1 tablet (80 mg total) by mouth daily at 6 PM. 30 tablet 0  . Cholecalciferol 2000 units CAPS Take 1 capsule by mouth daily.    . clopidogrel (PLAVIX) 75 MG tablet Take 1 tablet by mouth daily.    . fluticasone-salmeterol (ADVAIR HFA) 230-21 MCG/ACT inhaler Inhale 2 puffs into the lungs every 12 (twelve) hours as needed.    . furosemide (LASIX) 20 MG tablet Take 1-2 tablets by mouth daily.     Marland Kitchen guaiFENesin 200 MG  tablet Take 600 mg by mouth 2 (two) times daily.     Marland Kitchen ipratropium-albuterol (DUONEB) 0.5-2.5 (3) MG/3ML SOLN Take 3 mLs by nebulization 3 (three) times daily.    Marland Kitchen levalbuterol (XOPENEX) 1.25 MG/3ML nebulizer solution Take 1.25 mg by nebulization every 6 (six) hours as needed for wheezing or shortness of breath.    . memantine (NAMENDA) 10 MG tablet Take 1 tablet by mouth 2 (two) times daily.    . metoprolol succinate (TOPROL-XL) 25 MG 24 hr tablet Take 1 tablet by mouth daily.    . Omega-3 Fatty Acids (FISH OIL PO) Take 1 capsule by mouth 2 (two) times daily.    . potassium chloride (K-DUR) 10 MEQ tablet Take 1 tablet  by mouth daily as needed. Daughter gives when pt takes 2nd furosemide tablet    . ranitidine (ZANTAC) 75 MG tablet Take 150 mg by mouth daily.     . sodium chloride (OCEAN) 0.65 % SOLN nasal spray Place 2 sprays into both nostrils as needed for congestion.    Marland Kitchen tiotropium (SPIRIVA) 18 MCG inhalation capsule Place 18 mcg into inhaler and inhale daily.    Marland Kitchen Dextromethorphan-Guaifenesin 5-100 MG/5ML SYRP Take 5 mLs by mouth every 4 (four) hours as needed.    . [place] (DERMACLOUD) CREA Apply liberal amount topically to area of skin irritation as needed. Ok to leave at bedside    . OXYGEN Inhale 2 L into the lungs continuous.      Results for orders placed or performed during the hospital encounter of 09/27/17 (from the past 48 hour(s))  CBC     Status: Abnormal   Collection Time: 09/27/17  2:37 AM  Result Value Ref Range   WBC 11.6 (H) 3.6 - 11.0 K/uL   RBC 3.65 (L) 3.80 - 5.20 MIL/uL   Hemoglobin 10.7 (L) 12.0 - 16.0 g/dL   HCT 33.2 (L) 35.0 - 47.0 %   MCV 91.1 80.0 - 100.0 fL   MCH 29.4 26.0 - 34.0 pg   MCHC 32.3 32.0 - 36.0 g/dL   RDW 14.3 11.5 - 14.5 %   Platelets 205 150 - 440 K/uL    Comment: Performed at [facility], [address] [place], [place] [address]  Differential     Status: Abnormal   Collection Time: 09/27/17  2:37 AM  Result Value Ref Range   Neutrophils Relative % 90 %   Neutro Abs 10.4 (H) 1.4 - 6.5 K/uL   Lymphocytes Relative 5 %   Lymphs Abs 0.6 (L) 1.0 - 3.6 K/uL   Monocytes Relative 5 %   Monocytes Absolute 0.6 0.2 - 0.9 K/uL   Eosinophils Relative 0 %   Eosinophils Absolute 0.0 0 - 0.7 K/uL   Basophils Relative 0 %   Basophils Absolute 0.0 0 - 0.1 K/uL    Comment: Performed at [facility], [address] [place], [place] [address]  Basic metabolic panel     Status: Abnormal   Collection Time: 09/27/17  2:37 AM  Result Value Ref Range   Sodium 139 135 - 145 mmol/L   Potassium 4.1 3.5 - 5.1 mmol/L   Chloride 106 101 -  111 mmol/L   CO2 23 22 - 32 mmol/L   Glucose, Bld 206 (H) 65 - 99 mg/dL   BUN 46 (H) 6 - 20 mg/dL   Creatinine, Ser 2.33 (H) 0.44 - 1.00 mg/dL   Calcium 8.6 (L) 8.9 - 10.3 mg/dL   GFR calc non  Af Amer 18 (L) >60 mL/min   GFR calc Af Amer 21 (L) >60 mL/min    Comment: (NOTE) The eGFR has been calculated using the CKD EPI equation. This calculation has not been validated in all clinical situations. eGFR's persistently <60 mL/min signify possible Chronic Kidney Disease.    Anion gap 10 5 - 15    Comment: Performed at [facility], [address] [place], [place] [address]  Troponin I     Status: Abnormal   Collection Time: 09/27/17  2:37 AM  Result Value Ref Range   Troponin I 0.05 (HH) <0.03 ng/mL    Comment: CRITICAL RESULT CALLED TO, READ BACK BY AND VERIFIED WITH LAURIE LEMONS AT 0321 ON 09/27/17 [place]. Performed at [facility], [address] [place], [place] [address]   Brain natriuretic peptide     Status: Abnormal   Collection Time: 09/27/17  2:37 AM  Result Value Ref Range   B Natriuretic Peptide 1,069.0 (H) 0.0 - 100.0 pg/mL    Comment: Performed at [facility], [address] [place], [place] [address]   Dg Chest 2 View  Result Date: 09/27/2017 CLINICAL DATA:  Breathing difficulty EXAM: CHEST  2 VIEW COMPARISON:  08/16/2017 FINDINGS: Small bilateral pleural effusions. Cardiomegaly with vascular congestion and diffuse interstitial edema. Possible superimposed pneumonia at the bases. Aortic atherosclerosis. No pneumothorax. IMPRESSION: 1. Similar appearance of small bilateral effusions and bibasilar atelectasis or pneumonia 2. Continued cardiomegaly with vascular congestion and diffuse interstitial prominence compatible with mild edema. Electronically Signed   By: Donavan Foil M.D.   On: 09/27/2017 01:39    Review of Systems  Constitutional: Negative for chills and fever.  HENT: Negative for sore throat and tinnitus.   Eyes: Negative for  blurred vision and redness.  Respiratory: Positive for cough and shortness of breath.   Cardiovascular: Negative for chest pain, palpitations, orthopnea and PND.  Gastrointestinal: Negative for abdominal pain, diarrhea, nausea and vomiting.  Genitourinary: Negative for dysuria, frequency and urgency.  Musculoskeletal: Negative for joint pain and myalgias.  Skin: Negative for rash.       No lesions  Neurological: Negative for speech change, focal weakness and weakness.  Endo/Heme/Allergies: Does not bruise/bleed easily.       No temperature intolerance  Psychiatric/Behavioral: Negative for depression and suicidal ideas.    Blood pressure 137/61, pulse 92, temperature 98 F (36.7 C), temperature source Oral, resp. rate (!) 22, height 5' 7" (1.702 m), weight 68.9 kg (152 lb), SpO2 92 %. Physical Exam  Vitals reviewed. Constitutional: She is oriented to person, place, and time. She appears well-developed and well-nourished. No distress. Nasal cannula in place.  HENT:  Head: Normocephalic and atraumatic.  Mouth/Throat: Oropharynx is clear and moist.  Eyes: Conjunctivae and EOM are normal. Pupils are equal, round, and reactive to light. No scleral icterus.  Neck: Normal range of motion. Neck supple. No JVD present. No tracheal deviation present. No thyromegaly present.  Cardiovascular: Normal rate, regular rhythm and normal heart sounds. Exam reveals no gallop and no friction rub.  No murmur heard. Respiratory: Effort normal and breath sounds normal.  GI: Soft. Bowel sounds are normal. She exhibits no distension. There is no tenderness.  Genitourinary:  Genitourinary Comments: Deferred  Musculoskeletal: Normal range of motion. She exhibits no edema.  Lymphadenopathy:    She has no cervical adenopathy.  Neurological: She is alert and oriented to person, place, and time. No cranial nerve deficit. She exhibits normal muscle tone.  Skin:  Skin is warm and dry. No rash noted. No erythema.   Psychiatric: She has a normal mood and affect. Her behavior is normal. Judgment and thought content normal.     Assessment/Plan This is an 84 year old female for pneumonia. 1.  Pneumonia: Health care acquired; she is received ceftriaxone and azithromycin in the emergency department.  We will broaden her coverage with Levaquin, vancomycin and cefepime.  Continue supplemental oxygen as needed. 2.  Sepsis: The patient meets criteria via leukocytosis and tachypnea.  She is hemodynamically stable.  Follow blood cultures for growth and sensitivities.  Also follow sputum cultures. 3.  CHF: Systolic; acute on chronic.  The patient has interstitial edema.  She is received Lasix.  Assess fluid balance and diurese accordingly. 4.  CKD: Stage IV; avoid nephrotoxic agents. 5.  Hypertension: Controlled; continue amlodipine and metoprolol 6.  CAD: Stable; continue aspirin and Plavix 7.  COPD: Continue inhaled corticosteroid; albuterol as needed.  Continue Spiriva 8.  Dementia: Continue Namenda 9.  DVT prophylaxis: Heparin 10.  GI prophylaxis: None The patient is a DNR.  Time spent on admission orders and patient care approximately 45 minutes  Harrie Foreman, MD 09/27/2017, 6:08 AM

## 2017-09-28 ENCOUNTER — Inpatient Hospital Stay: Payer: Medicare Other

## 2017-09-28 DIAGNOSIS — I5020 Unspecified systolic (congestive) heart failure: Secondary | ICD-10-CM

## 2017-09-28 LAB — BLOOD GAS, ARTERIAL
Acid-base deficit: 3 mmol/L — ABNORMAL HIGH (ref 0.0–2.0)
Bicarbonate: 23.2 mmol/L (ref 20.0–28.0)
FIO2: 1
O2 Saturation: 98.9 %
PH ART: 7.32 — AB (ref 7.350–7.450)
Patient temperature: 37
pCO2 arterial: 45 mmHg (ref 32.0–48.0)
pO2, Arterial: 135 mmHg — ABNORMAL HIGH (ref 83.0–108.0)

## 2017-09-28 LAB — GLUCOSE, CAPILLARY
Glucose-Capillary: 181 mg/dL — ABNORMAL HIGH (ref 65–99)
Glucose-Capillary: 178 mg/dL — ABNORMAL HIGH (ref 65–99)

## 2017-09-28 LAB — HIV ANTIBODY (ROUTINE TESTING W REFLEX): HIV Screen 4th Generation wRfx: NONREACTIVE

## 2017-09-28 LAB — PROCALCITONIN: PROCALCITONIN: 0.19 ng/mL

## 2017-09-28 MED ORDER — CHLORHEXIDINE GLUCONATE 0.12 % MT SOLN
15.0000 mL | Freq: Two times a day (BID) | OROMUCOSAL | Status: DC
  Administered 2017-09-28 – 2017-10-03 (×10): 15 mL via OROMUCOSAL
  Filled 2017-09-28 (×9): qty 15

## 2017-09-28 MED ORDER — SODIUM CHLORIDE 0.9% FLUSH
10.0000 mL | Freq: Two times a day (BID) | INTRAVENOUS | Status: DC
  Administered 2017-09-29 – 2017-10-03 (×9): 10 mL

## 2017-09-28 MED ORDER — HYDRALAZINE HCL 20 MG/ML IJ SOLN
10.0000 mg | Freq: Once | INTRAMUSCULAR | Status: AC
  Administered 2017-09-28: 10 mg via INTRAVENOUS
  Filled 2017-09-28: qty 1

## 2017-09-28 MED ORDER — MORPHINE SULFATE (PF) 2 MG/ML IV SOLN
2.0000 mg | Freq: Once | INTRAVENOUS | Status: AC
  Administered 2017-09-28: 2 mg via INTRAVENOUS
  Filled 2017-09-28: qty 1

## 2017-09-28 MED ORDER — ORAL CARE MOUTH RINSE
15.0000 mL | Freq: Two times a day (BID) | OROMUCOSAL | Status: DC
  Administered 2017-09-28 – 2017-10-02 (×9): 15 mL via OROMUCOSAL

## 2017-09-28 MED ORDER — FUROSEMIDE 10 MG/ML IJ SOLN: 20.0000 mg | Freq: Once | INTRAMUSCULAR | Status: DC

## 2017-09-28 MED ORDER — FUROSEMIDE 10 MG/ML IJ SOLN
40.0000 mg | Freq: Once | INTRAMUSCULAR | Status: AC
  Administered 2017-09-28: 40 mg via INTRAVENOUS
  Filled 2017-09-28: qty 4

## 2017-09-28 MED ORDER — METHYLPREDNISOLONE SODIUM SUCC 125 MG IJ SOLR
60.0000 mg | INTRAMUSCULAR | Status: DC
  Administered 2017-09-28 – 2017-09-30 (×3): 60 mg via INTRAVENOUS
  Filled 2017-09-28 (×3): qty 2

## 2017-09-28 MED ORDER — SODIUM CHLORIDE 0.9% FLUSH: 10.0000 mL | INTRAVENOUS | Status: DC | PRN

## 2017-09-28 NOTE — Progress Notes (Signed)
Peripherally Inserted Central Catheter/Midline Placement  The IV Nurse has discussed with the patient and/or persons authorized to consent for the patient, the purpose of this procedure and the potential benefits and risks involved with this procedure.  The benefits include less needle sticks, lab draws from the catheter, and the patient may be discharged home with the catheter. Risks include, but not limited to, infection, bleeding, blood clot (thrombus formation), and puncture of an artery; nerve damage and irregular heartbeat and possibility to perform a PICC exchange if needed/ordered by physician.  Alternatives to this procedure were also discussed.  Bard Power PICC patient education guide, fact sheet on infection prevention and patient information card has been provided to patient /or left at bedside.    PICC/Midline Placement Documentation  PICC Double Lumen 09/28/17 PICC Right Basilic 39 cm 0 cm (Active)  Indication for Insertion or Continuance of Line Poor Vasculature-patient has had multiple peripheral attempts or PIVs lasting less than 24 hours 09/28/2017  5:00 PM  Exposed Catheter (cm) 0 cm 09/28/2017  5:00 PM  Site Assessment Clean;Dry;Intact 09/28/2017  5:00 PM  Lumen #1 Status Flushed;Blood return noted 09/28/2017  5:00 PM  Lumen #2 Status Flushed;Blood return noted 09/28/2017  5:00 PM  Dressing Type Transparent 09/28/2017  5:00 PM  Dressing Status Clean;Dry;Intact;Antimicrobial disc in place 09/28/2017  5:00 PM  Dressing Change Due 10/05/17 09/28/2017  5:00 PM    Telephone consent   Stacie GlazeJoyce, Koree Schopf [place] 09/28/2017, 5:52 PM

## 2017-09-28 NOTE — Significant Event (Signed)
Rapid response called- pt found having labored breathing- Oxygen sats 88% on 6 liters.  Chest xray ordered, lasix ordered.  Foley inserted.  Pt placed on non rebreather- and transferred to ICU to be placed on bipap.

## 2017-09-28 NOTE — Progress Notes (Signed)
Pt having labored breathing with O2 sats at 85% on 2.5 liters so I bumped her oxygen up to 4 liters. Beth (Press photographercharge nurse) called respiratory and paged Dr. Luberta MutterKonidena and after a few minutes her O2 sats improved to 93% but she still has labored breathing and SOB. I will continue to monitor the pt.

## 2017-09-28 NOTE — Progress Notes (Signed)
[facility] - [place] at [facility]   PATIENT NAME: Cassandra Mccall    MR#:  [id number]  DATE OF BIRTH:  Jan 01, 1929  SUBJECTIVE: Admitted for shortness of breath with acute respiratory failure found to have congestive heart failure, bibasilar pneumonia.  Started on IV Lasix, IV antibiotics but this morning patient was tachypneic, very hard to reach sats were 80% on 4 L, patient also was using accessory muscles of respiration.  ABG showed CO2 retention, patient being moved to  stepdown for close monitoring, I spoke with Dr. Lonn Georgia, I also informed the patient's daughter.  CHIEF COMPLAINT:   Chief Complaint  Patient presents with  . Respiratory Distress    REVIEW OF SYSTEMS:    Review of Systems  Unable to perform ROS: Dementia  she  Has Dementia to obtain review of systems.  Nutrition:  Tolerating Diet: Tolerating PT:      DRUG ALLERGIES:   Allergies  Allergen Reactions  . Statins     Other reaction(s): Muscle Pain Unclear if took  . Sulfa Antibiotics     VITALS:  Blood pressure (!) 161/111, pulse 99, temperature 98.2 F (36.8 C), resp. rate (!) 40, height 5\' 7"  (1.702 m), weight 68.9 kg (152 lb), SpO2 (!) 86 %.  PHYSICAL EXAMINATION:   Physical Exam  GENERAL:  84 y.o.-year-old patient lying in the bed with n respiratory distress  EYES: Pupils equal, round, reactive to light and accommodation. No scleral icterus. Extraocular muscles intact.  HEENT: Head atraumatic, normocephalic. Oropharynx and nasopharynx clear.  NECK:  Supple, no jugular venous distention. No thyroid enlargement, no tenderness.  LUNGS: Decreased breath sounds bilaterally CARDIOVASCULAR: S1, S2 normal. No murmurs, rubs, or gallops.  ABDOMEN: Soft, nontender, nondistended. Bowel sounds present. No organomegaly or mass.  EXTREMITIES: No pedal edema, cyanosis, or clubbing.  NEUROLOGIC: Dementia, unable to follow commands for neurological exam.  PSYCHIATRIC: The patient is  alert baseline dementia   sKIN: No obvious rash, lesion, or ulcer.    LABORATORY PANEL:   CBC Recent Labs  Lab 09/27/17 0237  WBC 11.6*  HGB 10.7*  HCT 33.2*  PLT 205   ------------------------------------------------------------------------------------------------------------------  Chemistries  Recent Labs  Lab 09/27/17 0237  NA 139  K 4.1  CL 106  CO2 23  GLUCOSE 206*  BUN 46*  CREATININE 2.33*  CALCIUM 8.6*   ------------------------------------------------------------------------------------------------------------------  Cardiac Enzymes Recent Labs  Lab 09/27/17 0237  TROPONINI 0.05*   ------------------------------------------------------------------------------------------------------------------  RADIOLOGY:  Dg Chest 2 View  Result Date: 09/27/2017 CLINICAL DATA:  Breathing difficulty EXAM: CHEST  2 VIEW COMPARISON:  08/16/2017 FINDINGS: Small bilateral pleural effusions. Cardiomegaly with vascular congestion and diffuse interstitial edema. Possible superimposed pneumonia at the bases. Aortic atherosclerosis. No pneumothorax. IMPRESSION: 1. Similar appearance of small bilateral effusions and bibasilar atelectasis or pneumonia 2. Continued cardiomegaly with vascular congestion and diffuse interstitial prominence compatible with mild edema. Electronically Signed   By: Jasmine Pang M.D.   On: 09/27/2017 01:39   Dg Pelvis 1-2 Views  Result Date: 09/27/2017 CLINICAL DATA:  Larey Seat yesterday with sacrococcygeal pain EXAM: PELVIS - 1-2 VIEW COMPARISON:  None. FINDINGS: The bones are diffusely osteopenic. Both hip joint spaces appear well preserved for age. The pelvic rami are intact. The SI joints show degenerative change but no acute sacral fracture is evident by plain film. There are degenerative changes in the lower lumbar spine. IMPRESSION: 1. No acute fracture. 2. Osteopenia. 3. Degenerative change in the lower lumbar spine and SI joints. Electronically Signed  By:  Dwyane DeePaul  Barry M.D.   On: 09/27/2017 11:14   Dg Sacrum/coccyx  Result Date: 09/27/2017 CLINICAL DATA:  Larey SeatFell yesterday with pain in the sacrococcygeal region EXAM: SACRUM AND COCCYX - 2+ VIEW COMPARISON:  None. FINDINGS: The bones are diffusely osteopenic. No acute sacral or coccygeal fracture is seen. The sacral foramina appear corticated. Degenerative changes are noted in the SI joints. IMPRESSION: No acute fracture.  Osteopenia. Electronically Signed   By: Dwyane DeePaul  Barry M.D.   On: 09/27/2017 11:14     ASSESSMENT AND PLAN:   Active Problems:   Pneumonia   Acute respiratory failure secondary to acute on chronic diastolic heart failure, bibasilar pneumonia: Worsening respiratory status this morning associated with hypoxia, tachypnea, does have CO2 retention: Patient will be transferred to ICU stepdown status, start BiPAP, continue bronchodilators, continue IV Lasix 40 mg twice daily but monitor kidney function closely, continue IV antibiotics with Levaquin, discussed the plan with patient's daughter and also I did inform Dr. Lonn Georgiaonforti about patient being transferred to ICU.  2.  CKD stage III: Stable monitor kidney function while on diuretics. 3.  H CAP: Continue Vanco and cefepime. Status DNR, prognosis poor, Discussed with patient's daughter.    All the records are reviewed and case discussed with Care Management/Social Workerr. Management plans discussed with the patient, family and they are in agreement.  CODE STATUS: DNR  TOTAL TIME TAKING CARE OF THIS PATIENT: 40 minutes.   Katha Hamming.   Germain Koopmann M.D on 09/28/2017 at 11:40 AM  Between 7am to 6pm - Pager - [phone]  After 6pm go to [email or web address] - password [email or web address]  [facility]  Office  [phone]  CC: Primary care physician; Lauro RegulusAnderson, Marshall W, MD

## 2017-09-28 NOTE — Progress Notes (Signed)
Spoke with patient's daughter to let her know there had been in a change in breathing status.  Pending transfer to stepdown.  Daughter verbalized understanding and stated she would come to hospital.

## 2017-09-28 NOTE — Consult Note (Signed)
Reason for Consult: Respiratory distress   Referring Physician: Hospitalist service  Cassandra Mccall is an 84 y.o. female.  HPI: Cassandra Mccall is a very pleasant 84 year old female with a past medical history remarkable for peripheral vascular disease, hypertension, diabetes, COPD, congestive heart failure, chronic renal insufficiency, Alzheimer's dementia, chronic bronchitis presented to the emergency department with point of shortness of breath. Patient's daughter's primary caregiver knows that she has been having increased work of breathing. Apparently she was discharged from a rehabilitation facility 2 months prior and has had deterioration in her status. Oxygen saturation was 87% upon arrival. Chest x-ray showed pulmonary edema pattern bilateral effusions and infiltrates consistent with congestive heart failure plus possible superimposed pneumonia. Was admitted to the hospital and started on cefepime, Levaquin, diuretic therapy Thomas Solu-Medrol, albuterol, Atrovent. This morning a rapid response was called secondary to progressive increasing shortness of breath. Patient was brought to the intensive care unit she was on a nonrebreather mask with still increased work of breathing. She was subsequently started on noninvasive ventilation, peripheral IV was inserted and diuretic therapy given.  Past Medical History:  Diagnosis Date  . Alzheimer disease 12/27/2015  . CHF (congestive heart failure) ([place])   . Chronic bronchitis ([place]) 12/27/2015  . Chronic kidney disease   . Congestive heart failure, NYHA class 1, chronic, diastolic ([place]) 85/88/5027  . Controlled type 2 diabetes mellitus without complication ([place]) 74/07/8785  . COPD (chronic obstructive pulmonary disease) ([place])   . Depression, major, in remission ([place]) 12/27/2015   on xanax prn  . Diabetes mellitus without complication ([place])   . Factor V Leiden ([place]) 12/27/2015  . Hypertension   . Left-sided carotid artery disease ([place]) 12/27/2015    100% left sided, left so right  . PAD (peripheral artery disease) ([place])   . PVD (peripheral vascular disease) ([place]) 12/27/2015   post stents in [place]    Past Surgical History:  Procedure Laterality Date  . BREAST EXCISIONAL BIOPSY    . TONSILLECTOMY    . VASCULAR SURGERY      Family History  Problem Relation Age of Onset  . COPD Mother   . Heart failure Mother   . Alzheimer's disease Father   . COPD Sister     Social History:  reports that she has quit smoking. Her smoking use included cigarettes. she has never used smokeless tobacco. She reports that she does not drink alcohol or use drugs.  Allergies:  Allergies  Allergen Reactions  . Statins     Other reaction(s): Muscle Pain Unclear if took  . Sulfa Antibiotics     Medications: I have reviewed the patient's current medications.  Results for orders placed or performed during the hospital encounter of 09/27/17 (from the past 48 hour(s))  CBC     Status: Abnormal   Collection Time: 09/27/17  2:37 AM  Result Value Ref Range   WBC 11.6 (H) 3.6 - 11.0 K/uL   RBC 3.65 (L) 3.80 - 5.20 MIL/uL   Hemoglobin 10.7 (L) 12.0 - 16.0 g/dL   HCT 33.2 (L) 35.0 - 47.0 %   MCV 91.1 80.0 - 100.0 fL   MCH 29.4 26.0 - 34.0 pg   MCHC 32.3 32.0 - 36.0 g/dL   RDW 14.3 11.5 - 14.5 %   Platelets 205 150 - 440 K/uL    Comment: Performed at [facility], [address], [place], [place] [address]  Differential     Status: Abnormal   Collection Time: 09/27/17  2:37 AM  Result Value Ref Range   Neutrophils Relative % 90 %   Neutro Abs 10.4 (H) 1.4 - 6.5 K/uL   Lymphocytes Relative 5 %   Lymphs Abs 0.6 (L) 1.0 - 3.6 K/uL   Monocytes Relative 5 %   Monocytes Absolute 0.6 0.2 - 0.9 K/uL   Eosinophils Relative 0 %   Eosinophils Absolute 0.0 0 - 0.7 K/uL   Basophils Relative 0 %   Basophils Absolute 0.0 0 - 0.1 K/uL    Comment: Performed at [facility], [address] [place], [place] [address]  Basic  metabolic panel     Status: Abnormal   Collection Time: 09/27/17  2:37 AM  Result Value Ref Range   Sodium 139 135 - 145 mmol/L   Potassium 4.1 3.5 - 5.1 mmol/L   Chloride 106 101 - 111 mmol/L   CO2 23 22 - 32 mmol/L   Glucose, Bld 206 (H) 65 - 99 mg/dL   BUN 46 (H) 6 - 20 mg/dL   Creatinine, Ser 2.33 (H) 0.44 - 1.00 mg/dL   Calcium 8.6 (L) 8.9 - 10.3 mg/dL   GFR calc non Af Amer 18 (L) >60 mL/min   GFR calc Af Amer 21 (L) >60 mL/min    Comment: (NOTE) The eGFR has been calculated using the CKD EPI equation. This calculation has not been validated in all clinical situations. eGFR's persistently <60 mL/min signify possible Chronic Kidney Disease.    Anion gap 10 5 - 15    Comment: Performed at [facility], [address] [place], [place] [address]  Troponin I     Status: Abnormal   Collection Time: 09/27/17  2:37 AM  Result Value Ref Range   Troponin I 0.05 (HH) <0.03 ng/mL    Comment: CRITICAL RESULT CALLED TO, READ BACK BY AND VERIFIED WITH LAURIE LEMONS AT 0321 ON 09/27/17 [place]. Performed at [facility], [address] [place], [place] [address]   Brain natriuretic peptide     Status: Abnormal   Collection Time: 09/27/17  2:37 AM  Result Value Ref Range   B Natriuretic Peptide 1,069.0 (H) 0.0 - 100.0 pg/mL    Comment: Performed at [facility], [address] [place], [place] [address]  TSH     Status: None   Collection Time: 09/27/17  5:20 AM  Result Value Ref Range   TSH 1.426 0.350 - 4.500 uIU/mL    Comment: Performed by a 3rd Generation assay with a functional sensitivity of <=0.01 uIU/mL. Performed at [facility], [address] [place], [place] [address]   HIV antibody     Status: None   Collection Time: 09/27/17  5:20 AM  Result Value Ref Range   HIV Screen 4th Generation wRfx Non Reactive Non Reactive    Comment: (NOTE) Performed At: [facility] [place], [place] [id number] Rush Farmer MD  VF:[id number] Performed at [facility], [address] [place],  [address]   MRSA PCR Screening     Status: None   Collection Time: 09/27/17 10:10 AM  Result Value Ref Range   MRSA by PCR NEGATIVE NEGATIVE    Comment:        The GeneXpert MRSA Assay (FDA approved for NASAL specimens only), is one component of a comprehensive MRSA colonization surveillance program. It is not intended to diagnose MRSA infection nor to guide or monitor treatment for MRSA infections. Performed at [facility], [place], [place]  [address]   Glucose, capillary     Status: Abnormal   Collection Time: 09/28/17 10:57 AM  Result Value Ref Range   Glucose-Capillary 181 (H) 65 - 99 mg/dL   Comment 1 Notify RN   Blood gas, arterial     Status: Abnormal   Collection Time: 09/28/17 11:01 AM  Result Value Ref Range   FIO2 1.00    Delivery systems NON-REBREATHER OXYGEN MASK    pH, Arterial 7.32 (L) 7.350 - 7.450   pCO2 arterial 45 32.0 - 48.0 mmHg   pO2, Arterial 135 (H) 83.0 - 108.0 mmHg   Bicarbonate 23.2 20.0 - 28.0 mmol/L   Acid-base deficit 3.0 (H) 0.0 - 2.0 mmol/L   O2 Saturation 98.9 %   Patient temperature 37.0    Collection site RIGHT BRACHIAL    Sample type ARTERIAL DRAW    Allens test (pass/fail) PASS PASS    Comment: Performed at [facility], [address] [place], [place] [address]  Glucose, capillary     Status: Abnormal   Collection Time: 09/28/17 11:57 AM  Result Value Ref Range   Glucose-Capillary 178 (H) 65 - 99 mg/dL    Dg Chest 2 View  Result Date: 09/27/2017 CLINICAL DATA:  Breathing difficulty EXAM: CHEST  2 VIEW COMPARISON:  08/16/2017 FINDINGS: Small bilateral pleural effusions. Cardiomegaly with vascular congestion and diffuse interstitial edema. Possible superimposed pneumonia at the bases. Aortic atherosclerosis. No pneumothorax. IMPRESSION: 1. Similar appearance of small bilateral effusions and bibasilar atelectasis or  pneumonia 2. Continued cardiomegaly with vascular congestion and diffuse interstitial prominence compatible with mild edema. Electronically Signed   By: Donavan Foil M.D.   On: 09/27/2017 01:39   Dg Pelvis 1-2 Views  Result Date: 09/27/2017 CLINICAL DATA:  [place] yesterday with sacrococcygeal pain EXAM: PELVIS - 1-2 VIEW COMPARISON:  None. FINDINGS: The bones are diffusely osteopenic. Both hip joint spaces appear well preserved for age. The pelvic rami are intact. The SI joints show degenerative change but no acute sacral fracture is evident by plain film. There are degenerative changes in the lower lumbar spine. IMPRESSION: 1. No acute fracture. 2. Osteopenia. 3. Degenerative change in the lower lumbar spine and SI joints. Electronically Signed   By: Ivar Drape M.D.   On: 09/27/2017 11:14   Dg Sacrum/coccyx  Result Date: 09/27/2017 CLINICAL DATA:  [place] yesterday with pain in the sacrococcygeal region EXAM: SACRUM AND COCCYX - 2+ VIEW COMPARISON:  None. FINDINGS: The bones are diffusely osteopenic. No acute sacral or coccygeal fracture is seen. The sacral foramina appear corticated. Degenerative changes are noted in the SI joints. IMPRESSION: No acute fracture.  Osteopenia. Electronically Signed   By: Ivar Drape M.D.   On: 09/27/2017 11:14   Dg Chest Port 1 View  Result Date: 09/28/2017 CLINICAL DATA:  Encounter for respiratory failure. Hx HTN, DM, COPD, CHF, bronchitis. Former smoker. EXAM: PORTABLE CHEST 1 VIEW COMPARISON:  09/27/2017 FINDINGS: Cardiac silhouette is mildly enlarged. There are bilateral pleural effusions. Bilateral interstitial thickening is noted greater on the right. There are prominent bronchovascular markings bilaterally. A small amount of fluid is noted in the peripheral minor fissure. No pneumothorax. Skeletal structures are demineralized but grossly intact. IMPRESSION: 1. Findings are similar to the previous day's study. Cardiomegaly with interstitial thickening, prominent  bronchovascular markings and bilateral effusions, greater on the right. Suspect asymmetric pulmonary edema due to congestive heart failure. Pneumonia is possible. Electronically Signed   By: Lajean Manes M.D.   On: 09/28/2017 11:45  ROS review of systems unable to be obtained at this time secondary to patient's underlying status Blood pressure (!) 161/111, pulse 99, temperature 98.2 F (36.8 C), resp. rate (!) 40, height 5' 7"  (6.387 m), weight 152 lb (68.9 kg), SpO2 (!) 86 %. Physical Exam  HEENT: Patient is labored with her respiratory rate presently on nonrebreather mask been switched to noninvasive ventilation. Trachea is midline, jugular venous distention is noted, no thyromegaly appreciated Cardiovascular: Irregularly irregular rhythm appreciated Pulmonary: Diffuse rales appreciated bilateral posterior Abdominal: Positive bowel sounds, soft Extremities: Edema is appreciated Neurologic: Moves all extremities no clear focal deficits noted   Assessment/Plan:  Congestive heart failure. EKG shows vascular congestion, increased vascular pedicle width, lymphangitic edema with bilateral effusions. BNP is elevated at 1069. EKG reveals right bundle branch block with right axis deviation . Will place patient on BiPAP for work of breathing and hypercapnic respiratory failure, diuretic therapy, difficult access will place PICC line  Possible pneumonia., Less inclined to think pneumonia, she has a minimal white count, she is afebrile, chest x-ray may be consistent entirely with pulmonary edema, empirically been covered with Levaquin and cefepime. We'll check sputum analysis and follow temperature curve and white count closely and consider de-escalation  Renal insufficiency. GFR is measured at 18. Patient with history of chronic medical renal disease  Critical Care Time 35 minutes  Gayleen Sholtz 09/28/2017, 12:33 PM

## 2017-09-28 NOTE — Progress Notes (Signed)
CH responded to rapid response page to patient's room. Currently there is no family present. Patient stated she is tired. Medical staff in room with patient. CH provided silent prayer outside room. CH will notify unit CH to follow-up with patient care.    09/28/17 1050  Clinical Encounter Type  Visited With Patient;Health care provider  Visit Type Initial;Spiritual support  Referral From Nurse;Physician  Stress Factors  Patient Stress Factors Health changes

## 2017-09-28 NOTE — Progress Notes (Signed)
Pharmacy Antibiotic Note  Cassandra Mccall is a 84 y.o. female admitted on 09/27/2017 with pneumonia.  Pharmacy  consulted for vancomycin and Levaquin dosing.  Plan:  Vancomycin: MRSA PCR negative. Will discuss with MD about discontinuing therapy  Levaquin: Continue levofloxacin 500 mg IV 48 hours.   Height: 5\' 7"  (170.2 cm) Weight: 152 lb (68.9 kg) IBW/kg (Calculated) : 61.6  Temp (24hrs), Avg:98.1 F (36.7 C), Min:97.8 F (36.6 C), Max:98.2 F (36.8 C)  Recent Labs  Lab 09/27/17 0237  WBC 11.6*  CREATININE 2.33*    Estimated Creatinine Clearance: 16.9 mL/min (A) (by C-G formula based on SCr of 2.33 mg/dL (H)).    Allergies  Allergen Reactions  . Statins     Other reaction(s): Muscle Pain Unclear if took  . Sulfa Antibiotics     Antimicrobials this admission: Levaquin, Cefepime, vancomycin 2/7  >>    >>   Dose adjustments this admission:   Microbiology results: 2/7 Sputum: pending  08/15/17 MRSA PCR: (-)      2/7 CXR: atelectasis vs. pneumonia Thank you for allowing pharmacy to be a part of this patient's care.  Cassandra Mccall D 09/28/2017 11:20 AM

## 2017-09-28 NOTE — Progress Notes (Signed)
PT Cancellation Note  Patient Details Name: Cassandra DanielMargie Zhao MRN: [id number] DOB: [birth year]   Cancelled Treatment:    Reason Eval/Treat Not Completed: Medical issues which prohibited therapy.  PT consult received.  During chart review, pt noted with respiratory concerns this morning.  Discussed pt with nursing who recommends holding PT at this time d/t respiratory concerns.  Will re-attempt PT evaluation at a later date/time as medically appropriate.  Hendricks LimesEmily Keanu Frickey, PT 09/28/17, 8:58 AM [phone]

## 2017-09-29 LAB — PROCALCITONIN: PROCALCITONIN: 0.3 ng/mL

## 2017-09-29 LAB — BASIC METABOLIC PANEL
Anion gap: 12 (ref 5–15)
BUN: 49 mg/dL — AB (ref 6–20)
CALCIUM: 9 mg/dL (ref 8.9–10.3)
CO2: 23 mmol/L (ref 22–32)
CREATININE: 2.41 mg/dL — AB (ref 0.44–1.00)
Chloride: 105 mmol/L (ref 101–111)
GFR calc Af Amer: 20 mL/min — ABNORMAL LOW (ref >60)
GFR, EST NON AFRICAN AMERICAN: 17 mL/min — AB (ref >60)
GLUCOSE: 186 mg/dL — AB (ref 65–99)
Potassium: 4.9 mmol/L (ref 3.5–5.1)
Sodium: 140 mmol/L (ref 135–145)

## 2017-09-29 NOTE — Progress Notes (Signed)
[facility] - [place] at [facility]   PATIENT NAME: Cassandra DanielMargie Piche    MR#:  [id number]  DATE OF BIRTH:  [birth year]  SUBJECTIVE:  CHIEF COMPLAINT:   Chief Complaint  Patient presents with  . Respiratory Distress  In discussion with intensivist, okay to transfer to telemetry bed, weaned off BiPAP  REVIEW OF SYSTEMS:  CONSTITUTIONAL: No fever, fatigue or weakness.  EYES: No blurred or double vision.  EARS, NOSE, AND THROAT: No tinnitus or ear pain.  RESPIRATORY: No cough, shortness of breath, wheezing or hemoptysis.  CARDIOVASCULAR: No chest pain, orthopnea, edema.  GASTROINTESTINAL: No nausea, vomiting, diarrhea or abdominal pain.  GENITOURINARY: No dysuria, hematuria.  ENDOCRINE: No polyuria, nocturia,  HEMATOLOGY: No anemia, easy bruising or bleeding SKIN: No rash or lesion. MUSCULOSKELETAL: No joint pain or arthritis.   NEUROLOGIC: No tingling, numbness, weakness.  PSYCHIATRY: No anxiety or depression.   ROS  DRUG ALLERGIES:   Allergies  Allergen Reactions  . Statins     Other reaction(s): Muscle Pain Unclear if took  . Sulfa Antibiotics     VITALS:  Blood pressure (!) 120/53, pulse 97, temperature 98.5 F (36.9 C), temperature source Oral, resp. rate (!) 31, height 5\' 7"  (1.702 m), weight 68.9 kg (152 lb), SpO2 99 %.  PHYSICAL EXAMINATION:  GENERAL:  84 y.o.-year-old patient lying in the bed with no acute distress.  EYES: Pupils equal, round, reactive to light and accommodation. No scleral icterus. Extraocular muscles intact.  HEENT: Head atraumatic, normocephalic. Oropharynx and nasopharynx clear.  NECK:  Supple, no jugular venous distention. No thyroid enlargement, no tenderness.  LUNGS: Normal breath sounds bilaterally, no wheezing, rales,rhonchi or crepitation. No use of accessory muscles of respiration.  CARDIOVASCULAR: S1, S2 normal. No murmurs, rubs, or gallops.  ABDOMEN: Soft, nontender, nondistended. Bowel sounds present. No organomegaly  or mass.  EXTREMITIES: No pedal edema, cyanosis, or clubbing.  NEUROLOGIC: Cranial nerves II through XII are intact. Muscle strength 5/5 in all extremities. Sensation intact. Gait not checked.  PSYCHIATRIC: The patient is alert and oriented x 3.  SKIN: No obvious rash, lesion, or ulcer.   Physical Exam LABORATORY PANEL:   CBC Recent Labs  Lab 09/27/17 0237  WBC 11.6*  HGB 10.7*  HCT 33.2*  PLT 205   ------------------------------------------------------------------------------------------------------------------  Chemistries  Recent Labs  Lab 09/29/17 0446  NA 140  K 4.9  CL 105  CO2 23  GLUCOSE 186*  BUN 49*  CREATININE 2.41*  CALCIUM 9.0   ------------------------------------------------------------------------------------------------------------------  Cardiac Enzymes Recent Labs  Lab 09/27/17 0237  TROPONINI 0.05*   ------------------------------------------------------------------------------------------------------------------  RADIOLOGY:  Dg Chest Port 1 View  Result Date: 09/28/2017 CLINICAL DATA:  Encounter for respiratory failure. Hx HTN, DM, COPD, CHF, bronchitis. Former smoker. EXAM: PORTABLE CHEST 1 VIEW COMPARISON:  09/27/2017 FINDINGS: Cardiac silhouette is mildly enlarged. There are bilateral pleural effusions. Bilateral interstitial thickening is noted greater on the right. There are prominent bronchovascular markings bilaterally. A small amount of fluid is noted in the peripheral minor fissure. No pneumothorax. Skeletal structures are demineralized but grossly intact. IMPRESSION: 1. Findings are similar to the previous day's study. Cardiomegaly with interstitial thickening, prominent bronchovascular markings and bilateral effusions, greater on the right. Suspect asymmetric pulmonary edema due to congestive heart failure. Pneumonia is possible. Electronically Signed   By: Amie Portlandavid  Ormond M.D.   On: 09/28/2017 11:45    ASSESSMENT AND PLAN:  1 acute  hypoxic respiratory failure Secondary to pneumonia congestive heart failure exacerbation Requiring BiPAP use in the  ICU-successfully weaned off, in discussion with intensivist, okay to transfer to telemetry bed, supplemental oxygen with weaning as tolerated  2  acute probable community-acquired pneumonia  Stable  Continue empiric antibiotics, pneumonia protocol, follow-up on cultures   3 acute on chronic diastolic congestive heart failure exacerbation  Resolving  We will continue diuretic therapy, strict I&O monitoring, daily weights, low-sodium/cardiac diet, transfer to telemetry bed   4 CKD stage III Stable  Continue to avoid nephrotoxic agents, BMP daily   5 chronic dementia  Stable  Increase nursing care as needed, aspiration/fall/skin care precautions   All the records are reviewed and case discussed with Care Management/Social Workerr. Management plans discussed with the patient, family and they are in agreement.  CODE STATUS: dnr  TOTAL TIME TAKING CARE OF THIS PATIENT: 35 minutes.     POSSIBLE D/C IN 1-3 DAYS, DEPENDING ON CLINICAL CONDITION.   Evelena Asa Salary M.D on 09/29/2017   Between 7am to 6pm - Pager - [phone]  After 6pm go to [email or web address] - Social research officer, government  [facility]  Office  [phone]  CC: Primary care physician; Lauro Regulus, MD  Note: This dictation was prepared with Dragon dictation along with smaller phrase technology. Any transcriptional errors that result from this process are unintentional.

## 2017-09-29 NOTE — Progress Notes (Signed)
09/29/17 1700  PT Visit Information  Last PT Received On 09/29/17  Assistance Needed +1  History of Present Illness 84 yo female with CHF and pulm edema was hypoxic and admitted with poss but doubtful PNA.  PMHx:  CHF, PNA, UTI, MI, PVD, ALZ, PAD, COPD, smoker, bronchitis, HTN, DM, osteopenia, abnormal liver fn, chronic respiratory failure  Precautions  Precautions Fall (teleemetry)  Restrictions  Weight Bearing Restrictions No  Home Living  Family/patient expects to be discharged to: Private residence  Living Arrangements Children  Available Help at Discharge Family;Available 24 hours/day  Type of Home House  Home Access Stairs to enter  Entrance Stairs-Number of Steps 4  Entrance Stairs-Rails Right;Left;Can reach both  Home Layout One level  Horticulturist, commercialBathroom Toilet Standard  Bathroom Accessibility No  Home Equipment Walker - 4 wheels;Shower seat;Other (comment);Walker - 2 wheels  Prior Function  Level of Independence Independent with assistive device(s)  Communication  Communication No difficulties  Pain Assessment  Pain Assessment No/denies pain  Cognition  Arousal/Alertness Awake/alert  Behavior During Therapy WFL for tasks assessed/performed;Impulsive  Overall Cognitive Status Within Functional Limits for tasks assessed  Upper Extremity Assessment  Upper Extremity Assessment Overall WFL for tasks assessed  Lower Extremity Assessment  Lower Extremity Assessment Generalized weakness  Cervical / Trunk Assessment  Cervical / Trunk Assessment Kyphotic  Bed Mobility  Overal bed mobility Needs Assistance  Bed Mobility Supine to Sit;Sit to Supine  Supine to sit Max assist  Sit to supine Max assist  Transfers  General transfer comment declined over the limited ability to stand from higher hosp   ed  Ambulation/Gait  General Gait Details unable  Balance  Overall balance assessment Needs assistance  Sitting-balance support Feet supported;Bilateral upper extremity supported   Sitting balance-Leahy Scale Fair  Standing balance support Bilateral upper extremity supported  Standing balance-Leahy Scale Normal  General Comments  General comments (skin integrity, edema, etc.) significant LE edema which was repositioned  at end of session  Exercises  Exercises Other exercises (LE strength 4- to 4 )  PT - End of Session  Equipment Utilized During Treatment Gait belt;Oxygen  Activity Tolerance Patient tolerated treatment well;Patient limited by lethargy  Patient left in bed;with call bell/phone within reach;with family/visitor present  Nurse Communication Mobility status  PT Assessment  PT Recommendation/Assessment Patient needs continued PT services  PT Visit Diagnosis Unsteadiness on feet (R26.81);Other abnormalities of gait and mobility (R26.89);Repeated falls (R29.6);Muscle weakness (generalized) (M62.81);History of falling (Z91.81);Ataxic gait (R26.0);Apraxia (R48.2);Other symptoms and signs involving the nervous system (R29.898);Difficulty in walking, not elsewhere classified (R26.2);BPPV;Adult, failure to thrive (R62.7);Dizziness and giddiness (R42);Pain;Other (comment);Hemiplegia and hemiparesis  PT Plan  PT Frequency (ACUTE ONLY) Min 2X/week  PT Treatment/Interventions (ACUTE ONLY) DME instruction;Gait training;Stair training;Functional mobility training;Therapeutic activities;Therapeutic exercise;Balance training;Neuromuscular re-education;Patient/family education  AM-PAC PT "6 Clicks" Daily Activity Outcome Measure  Difficulty turning over in bed (including adjusting bedclothes, sheets and blankets)? 1  Difficulty moving from lying on back to sitting on the side of the bed?  1  Difficulty sitting down on and standing up from a chair with arms (e.g., wheelchair, bedside commode, etc,.)? 1  Help needed moving to and from a bed to chair (including a wheelchair)? 2  Help needed walking in hospital room? 2  Help needed climbing 3-5 steps with a railing?  2  6  Click Score 9  Mobility G Code  CL  PT Recommendation  PT equipment Rolling walker with 5" wheels  Individuals Consulted  Consulted and Agree with Results and  Recommendations Patient  Acute Rehab PT Goals  Patient Stated Goal no goal  PT Goal Formulation With patient  Potential to Achieve Goals Poor  PT Time Calculation  PT Start Time (ACUTE ONLY) 1345  PT Stop Time (ACUTE ONLY) 1602  PT Time Calculation (min) (ACUTE ONLY) 137 min  PT General Charges  $$ ACUTE PT VISIT 1 Visit  PT Evaluation  $PT Eval High Complexity 1 High  PT Treatments  $Gait Training 23-37 mins  Written Expression  Dominant Hand Right    Samul Dada, PT MS Acute Rehab Dept. Number: [facility] [id number] and [facility] [phone]

## 2017-09-29 NOTE — Progress Notes (Signed)
Bipap on standby at this time, patient tol  well.

## 2017-09-29 NOTE — Progress Notes (Signed)
Taken off of bipap and placed on 5l [place], saturations were 100% on bipap at 40% fio2.

## 2017-09-29 NOTE — Progress Notes (Signed)
[facility] Medicine Progess Note    SYNOPSIS   84 year old female with a past medical history remarkable for peripheral vascular disease, hypertension, diabetes, COPD, congestive heart failure, chronic renal insufficiency, Alzheimer's dementia, chronic bronchitis rapid response yesterday for progressive shortness of breath consistent with congestive heart failure.   ASSESSMENT/PLAN   Congestive heart failure. Patient doing much better this morning, has been diuresed nearly 2 L. Weaned off of noninvasive ventilation, presently on nasal cannula, eating breakfast.   Possible pneumonia., Less inclined to think pneumonia, she has a minimal white count, she is afebrile, chest x-ray may be consistent entirely with pulmonary edema, empirically been covered with Levaquin and cefepime. We'll check sputum analysis and follow temperature curve and white count closely and consider de-escalation  Renal insufficiency. Slight worsening of BUN/creatinine, most likely secondary diuresis  Will monitor closely in the intensive care unit and if able to stay off of NIV will transfer to telemetry   INTAKE / OUTPUT:  Intake/Output Summary (Last 24 hours) at 09/29/2017 0842 Last data filed at 09/29/2017 0551 Gross per 24 hour  Intake 120 ml  Output 1970 ml  Net -1850 ml    Name: Cassandra Mccall MRN: [id number] DOB: 15-Mar-1929    ADMISSION DATE:  09/27/2017  SUBJECTIVE:   Patient doing well, has been weaned off of NIV Cassandra Mccall nasal cannula this morning. +2 L diuresis yesterday  VITAL SIGNS: Temp:  [96.6 F (35.9 C)-99.1 F (37.3 C)] 99.1 F (37.3 C) (02/09 0200) Pulse Rate:  [78-99] 78 (02/09 0500) Resp:  [21-42] 21 (02/09 0500) BP: (82-161)/(48-117) 127/64 (02/09 0500) SpO2:  [86 %-100 %] 100 % (02/09 0806) FiO2 (%):  [40 %-90 %] 40 % (02/09 0806)  PHYSICAL EXAMINATION: Physical Examination:   VS: BP 127/64   Pulse 78   Temp 99.1 F (37.3 C) (Axillary)   Resp (!) 21   Ht 5'  7" (1.702 m)   Wt 152 lb (68.9 kg)   SpO2 100%   BMI 23.81 kg/m   General Appearance: No distress  Neuro:without focal findings, mental status normal. HEENT: PERRLA, EOM intact. Pulmonary: Scant basilar crackles  CardiovascularNormal S1,S2.  No m/r/g.   Abdomen: Benign, Soft, non-tender. Skin:   warm, no rashes, no ecchymosis  Extremities: normal, no cyanosis, clubbing.    LABORATORY PANEL:   CBC Recent Labs  Lab 09/27/17 0237  WBC 11.6*  HGB 10.7*  HCT 33.2*  PLT 205    Chemistries  Recent Labs  Lab 09/29/17 0446  NA 140  K 4.9  CL 105  CO2 23  GLUCOSE 186*  BUN 49*  CREATININE 2.41*  CALCIUM 9.0    Recent Labs  Lab 09/28/17 1057 09/28/17 1157  GLUCAP 181* 178*   Recent Labs  Lab 09/28/17 1101  PHART 7.32*  PCO2ART 45  PO2ART 135*   No results for input(s): AST, ALT, ALKPHOS, BILITOT, ALBUMIN in the last 168 hours.  Cardiac Enzymes Recent Labs  Lab 09/27/17 0237  TROPONINI 0.05*    RADIOLOGY:  Dg Pelvis 1-2 Views  Result Date: 09/27/2017 CLINICAL DATA:  Larey Seat yesterday with sacrococcygeal pain EXAM: PELVIS - 1-2 VIEW COMPARISON:  None. FINDINGS: The bones are diffusely osteopenic. Both hip joint spaces appear well preserved for age. The pelvic rami are intact. The SI joints show degenerative change but no acute sacral fracture is evident by plain film. There are degenerative changes in the lower lumbar spine. IMPRESSION: 1. No acute fracture. 2. Osteopenia. 3. Degenerative change in the lower lumbar  spine and SI joints. Electronically Signed   By: Dwyane DeePaul  Barry M.D.   On: 09/27/2017 11:14   Dg Sacrum/coccyx  Result Date: 09/27/2017 CLINICAL DATA:  Larey SeatFell yesterday with pain in the sacrococcygeal region EXAM: SACRUM AND COCCYX - 2+ VIEW COMPARISON:  None. FINDINGS: The bones are diffusely osteopenic. No acute sacral or coccygeal fracture is seen. The sacral foramina appear corticated. Degenerative changes are noted in the SI joints. IMPRESSION: No acute  fracture.  Osteopenia. Electronically Signed   By: Dwyane DeePaul  Barry M.D.   On: 09/27/2017 11:14   Dg Chest Port 1 View  Result Date: 09/28/2017 CLINICAL DATA:  Encounter for respiratory failure. Hx HTN, DM, COPD, CHF, bronchitis. Former smoker. EXAM: PORTABLE CHEST 1 VIEW COMPARISON:  09/27/2017 FINDINGS: Cardiac silhouette is mildly enlarged. There are bilateral pleural effusions. Bilateral interstitial thickening is noted greater on the right. There are prominent bronchovascular markings bilaterally. A small amount of fluid is noted in the peripheral minor fissure. No pneumothorax. Skeletal structures are demineralized but grossly intact. IMPRESSION: 1. Findings are similar to the previous day's study. Cardiomegaly with interstitial thickening, prominent bronchovascular markings and bilateral effusions, greater on the right. Suspect asymmetric pulmonary edema due to congestive heart failure. Pneumonia is possible. Electronically Signed   By: Amie Portlandavid  Ormond M.D.   On: 09/28/2017 11:45    Cassandra KindredJohn Marshayla Mitschke, DO  2/9/2019Patient ID: Cassandra DanielMargie Mccall, female   DOB: 02/27/1929, 84 y.o.   MRN: [id number]

## 2017-09-29 NOTE — Progress Notes (Addendum)
Pharmacy Antibiotic Note  Cassandra Mccall is a 84 y.o. female admitted on 09/27/2017 with pneumonia.  Pharmacy  consulted for vancomycin and Levaquin dosing. Vancomycin discontinued 09/29/17  Plan: Levaquin: Continue levofloxacin 500 mg IV 48 hours. (Pt also on cefepime)  Height: 5\' 7"  (170.2 cm) Weight: 152 lb (68.9 kg) IBW/kg (Calculated) : 61.6  Temp (24hrs), Avg:98.8 F (37.1 C), Min:98.5 F (36.9 C), Max:99.1 F (37.3 C)  Recent Labs  Lab 09/27/17 0237 09/29/17 0446  WBC 11.6*  --   CREATININE 2.33* 2.41*    Estimated Creatinine Clearance: 16.3 mL/min (A) (by C-G formula based on SCr of 2.41 mg/dL (H)).    Allergies  Allergen Reactions  . Statins     Other reaction(s): Muscle Pain Unclear if took  . Sulfa Antibiotics     Antimicrobials this admission: Levaquin, Cefepime,  2/7  >>    >>  Vanc 2/7 >> 2/9  Dose adjustments this admission:   Microbiology results: 2/7 Sputum: pending  08/15/17 MRSA PCR: (-)  2/7 CXR: atelectasis vs. pneumonia Thank you for allowing pharmacy to be a part of this patient's care.  Colbey Wirtanen A 09/29/2017 4:03 PM

## 2017-09-30 LAB — PROCALCITONIN: PROCALCITONIN: 0.32 ng/mL

## 2017-09-30 NOTE — Progress Notes (Signed)
[employer] - [place] at [facility]   PATIENT NAME: Cassandra Mccall    MR#:  [id number]  DATE OF BIRTH:  Aug 25, 1928  SUBJECTIVE:  CHIEF COMPLAINT:   Chief Complaint  Patient presents with  . Respiratory Distress  Patient is confused/disoriented per baseline, no events overnight per nursing staff, proceed with discontinuing Foley, wean O2 off as tolerated, ambulate with assistance, physical therapy while in house  REVIEW OF SYSTEMS:  CONSTITUTIONAL: No fever, fatigue or weakness.  EYES: No blurred or double vision.  EARS, NOSE, AND THROAT: No tinnitus or ear pain.  RESPIRATORY: No cough, shortness of breath, wheezing or hemoptysis.  CARDIOVASCULAR: No chest pain, orthopnea, edema.  GASTROINTESTINAL: No nausea, vomiting, diarrhea or abdominal pain.  GENITOURINARY: No dysuria, hematuria.  ENDOCRINE: No polyuria, nocturia,  HEMATOLOGY: No anemia, easy bruising or bleeding SKIN: No rash or lesion. MUSCULOSKELETAL: No joint pain or arthritis.   NEUROLOGIC: No tingling, numbness, weakness.  PSYCHIATRY: No anxiety or depression.   ROS  DRUG ALLERGIES:   Allergies  Allergen Reactions  . Statins     Other reaction(s): Muscle Pain Unclear if took  . Sulfa Antibiotics     VITALS:  Blood pressure (!) 155/60, pulse 80, temperature 97.9 F (36.6 C), temperature source Oral, resp. rate 20, height 5\' 3"  (1.6 m), weight 68.6 kg (151 lb 3.8 oz), SpO2 97 %.  PHYSICAL EXAMINATION:  GENERAL:  84 y.o.-year-old patient lying in the bed with no acute distress.  EYES: Pupils equal, round, reactive to light and accommodation. No scleral icterus. Extraocular muscles intact.  HEENT: Head atraumatic, normocephalic. Oropharynx and nasopharynx clear.  NECK:  Supple, no jugular venous distention. No thyroid enlargement, no tenderness.  LUNGS: Normal breath sounds bilaterally, no wheezing, rales,rhonchi or crepitation. No use of accessory muscles of respiration.  CARDIOVASCULAR: S1,  S2 normal. No murmurs, rubs, or gallops.  ABDOMEN: Soft, nontender, nondistended. Bowel sounds present. No organomegaly or mass.  EXTREMITIES: No pedal edema, cyanosis, or clubbing.  NEUROLOGIC: Cranial nerves II through XII are intact. Muscle strength 5/5 in all extremities. Sensation intact. Gait not checked.  PSYCHIATRIC: The patient is alert and oriented x 3.  SKIN: No obvious rash, lesion, or ulcer.   Physical Exam LABORATORY PANEL:   CBC Recent Labs  Lab 09/27/17 0237  WBC 11.6*  HGB 10.7*  HCT 33.2*  PLT 205   ------------------------------------------------------------------------------------------------------------------  Chemistries  Recent Labs  Lab 09/29/17 0446  NA 140  K 4.9  CL 105  CO2 23  GLUCOSE 186*  BUN 49*  CREATININE 2.41*  CALCIUM 9.0   ------------------------------------------------------------------------------------------------------------------  Cardiac Enzymes Recent Labs  Lab 09/27/17 0237  TROPONINI 0.05*   ------------------------------------------------------------------------------------------------------------------  RADIOLOGY:  No results found.  ASSESSMENT AND PLAN:  1 acute hypoxic respiratory failure Resolving Secondary to acute on chronic congestive heart failure exacerbation >> CAP Did require ICU care with BiPAP during initial hospital stay-successfully weaned off, continue to wean O2 off as tolerated, increase activity, physical therapy following, discontinue Foley  2 acute probable community-acquired pneumonia  Resolving Continue Levaquin for 5-day course    3 acute on chronic diastolic congestive heart failure exacerbation  Resolving  Continue IV Lasix on our congestive heart failure protocol, low-sodium/cardiac diet, strict I&O monitoring, daily weights, discontinue Foley, wean O2 off as tolerated, physical therapy following, ambulate with assistance, and tentative plans for discharge on tomorrow in care of  family  4 CKD stage III Stable  Avoid nephrotoxic agents  5 chronic dementia  Stable  Continue  increased nursing care, aspiration/fall/skin care precautions   All the records are reviewed and case discussed with Care Management/Social Workerr. Management plans discussed with the patient, family and they are in agreement.  CODE STATUS: dnr  TOTAL TIME TAKING CARE OF THIS PATIENT: 35 minutes.     POSSIBLE D/C IN 1-2 DAYS, DEPENDING ON CLINICAL CONDITION.   Evelena AsaMontell D Salary M.D on 09/30/2017   Between 7am to 6pm - Pager - [phone]  After 6pm go to [email or web address] - Social research officer, governmentpassword EPAS [facility]  [facility] [place] [facility]  Office  [phone]  CC: Primary care physician; Lauro RegulusAnderson, Marshall W, MD  Note: This dictation was prepared with Dragon dictation along with smaller phrase technology. Any transcriptional errors that result from this process are unintentional.

## 2017-09-30 NOTE — Progress Notes (Signed)
Pt vomited after meal this evening. Pt family stated she has vomited before each meal today, and that they believe it has to do with her hiatal hernia. Family would like to talk to Dr. Katheren ShamsSalary about concerns before D/C. Pt family asked for foley to not be removed today. I did not remove per their request. O2 was weaned from 3L to 1.5L successfully during shift. Pt sating @ 93% on 1.5L [place]. O2 desated to 84% on RA and 89% @ 1L. Pt family did not feel safe having pt ambulate. [facility].

## 2017-10-01 ENCOUNTER — Inpatient Hospital Stay: Payer: Medicare Other

## 2017-10-01 LAB — BASIC METABOLIC PANEL WITH GFR
Anion gap: 11 (ref 5–15)
BUN: 71 mg/dL — ABNORMAL HIGH (ref 6–20)
CO2: 23 mmol/L (ref 22–32)
Calcium: 8.9 mg/dL (ref 8.9–10.3)
Chloride: 104 mmol/L (ref 101–111)
Creatinine, Ser: 2.58 mg/dL — ABNORMAL HIGH (ref 0.44–1.00)
GFR calc Af Amer: 18 mL/min — ABNORMAL LOW
GFR calc non Af Amer: 16 mL/min — ABNORMAL LOW
Glucose, Bld: 176 mg/dL — ABNORMAL HIGH (ref 65–99)
Potassium: 5.2 mmol/L — ABNORMAL HIGH (ref 3.5–5.1)
Sodium: 138 mmol/L (ref 135–145)

## 2017-10-01 MED ORDER — FUROSEMIDE 40 MG PO TABS
40.0000 mg | ORAL_TABLET | Freq: Every day | ORAL | Status: DC
  Administered 2017-10-02: 40 mg via ORAL
  Filled 2017-10-01: qty 1

## 2017-10-01 MED ORDER — METOCLOPRAMIDE HCL 5 MG/ML IJ SOLN
5.0000 mg | Freq: Three times a day (TID) | INTRAMUSCULAR | Status: DC
  Administered 2017-10-01: 5 mg via INTRAVENOUS
  Filled 2017-10-01: qty 2

## 2017-10-01 MED ORDER — PATIROMER SORBITEX CALCIUM 8.4 G PO PACK
8.4000 g | PACK | Freq: Every day | ORAL | Status: DC
  Filled 2017-10-01 (×2): qty 4

## 2017-10-01 MED ORDER — METHYLPREDNISOLONE SODIUM SUCC 125 MG IJ SOLR
60.0000 mg | Freq: Once | INTRAMUSCULAR | Status: AC
  Administered 2017-10-01: 16:00:00 60 mg via INTRAVENOUS
  Filled 2017-10-01: qty 2

## 2017-10-01 MED ORDER — PREDNISONE 50 MG PO TABS
50.0000 mg | ORAL_TABLET | Freq: Every day | ORAL | Status: DC
  Administered 2017-10-02: 50 mg via ORAL
  Filled 2017-10-01: qty 1

## 2017-10-01 MED ORDER — FUROSEMIDE 10 MG/ML IJ SOLN
20.0000 mg | Freq: Once | INTRAMUSCULAR | Status: AC
  Administered 2017-10-01: 20 mg via INTRAVENOUS
  Filled 2017-10-01: qty 2

## 2017-10-01 MED ORDER — METOCLOPRAMIDE HCL 5 MG/ML IJ SOLN
10.0000 mg | Freq: Three times a day (TID) | INTRAMUSCULAR | Status: DC
  Administered 2017-10-01 – 2017-10-02 (×6): 10 mg via INTRAVENOUS
  Filled 2017-10-01 (×6): qty 2

## 2017-10-01 NOTE — Progress Notes (Signed)
[employer] [facility] [place] [facility]   PATIENT NAME: Cassandra DanielMargie Mccall    MR#:  [id number]  DATE OF BIRTH:  Jan 06, 1929  SUBJECTIVE:  CHIEF COMPLAINT:   Chief Complaint  Patient presents with  . Respiratory Distress  Patient is poor historian due to advanced dementia, patient is near max assist with PT-we will need skilled nursing facility and discussion with physical therapy, discussion with nursing staff will discontinue Foley, had episodes of emesis on yesterday, abdominal x-rays noted for constipation-we will place on Reglan, fleets enema x1  REVIEW OF SYSTEMS:  CONSTITUTIONAL: No fever, fatigue or weakness.  EYES: No blurred or double vision.  EARS, NOSE, AND THROAT: No tinnitus or ear pain.  RESPIRATORY: No cough, shortness of breath, wheezing or hemoptysis.  CARDIOVASCULAR: No chest pain, orthopnea, edema.  GASTROINTESTINAL: No nausea, vomiting, diarrhea or abdominal pain.  GENITOURINARY: No dysuria, hematuria.  ENDOCRINE: No polyuria, nocturia,  HEMATOLOGY: No anemia, easy bruising or bleeding SKIN: No rash or lesion. MUSCULOSKELETAL: No joint pain or arthritis.   NEUROLOGIC: No tingling, numbness, weakness.  PSYCHIATRY: No anxiety or depression.   ROS  DRUG ALLERGIES:   Allergies  Allergen Reactions  . Statins     Other reaction(s): Muscle Pain Unclear if took  . Sulfa Antibiotics     VITALS:  Blood pressure (!) 148/57, pulse 66, temperature 98 F (36.7 C), temperature source Oral, resp. rate 18, height 5\' 3"  (1.6 m), weight 68.4 kg (150 lb 11.2 oz), SpO2 100 %.  PHYSICAL EXAMINATION:  GENERAL:  84 y.o.-year-old patient lying in the bed with no acute distress.  EYES: Pupils equal, round, reactive to light and accommodation. No scleral icterus. Extraocular muscles intact.  HEENT: Head atraumatic, normocephalic. Oropharynx and nasopharynx clear.  NECK:  Supple, no jugular venous distention. No thyroid enlargement, no tenderness.  LUNGS: Normal  breath sounds bilaterally, no wheezing, rales,rhonchi or crepitation. No use of accessory muscles of respiration.  CARDIOVASCULAR: S1, S2 normal. No murmurs, rubs, or gallops.  ABDOMEN: Soft, nontender, nondistended. Bowel sounds present. No organomegaly or mass.  EXTREMITIES: No pedal edema, cyanosis, or clubbing.  NEUROLOGIC: Cranial nerves II through XII are intact. Muscle strength 5/5 in all extremities. Sensation intact. Gait not checked.  PSYCHIATRIC: The patient is alert and oriented x 3.  SKIN: No obvious rash, lesion, or ulcer.   Physical Exam LABORATORY PANEL:   CBC Recent Labs  Lab 09/27/17 0237  WBC 11.6*  HGB 10.7*  HCT 33.2*  PLT 205   ------------------------------------------------------------------------------------------------------------------  Chemistries  Recent Labs  Lab 10/01/17 0320  NA 138  K 5.2*  CL 104  CO2 23  GLUCOSE 176*  BUN 71*  CREATININE 2.58*  CALCIUM 8.9   ------------------------------------------------------------------------------------------------------------------  Cardiac Enzymes Recent Labs  Lab 09/27/17 0237  TROPONINI 0.05*   ------------------------------------------------------------------------------------------------------------------  RADIOLOGY:  Dg Abd 2 Views  Result Date: 10/01/2017 CLINICAL DATA:  Vomiting, increasing shortness of breath. History of CHF, COPD, former smoker, coronary artery disease, and diabetes. EXAM: ABDOMEN - 2 VIEW COMPARISON:  AP pelvis of September 27, 2017 FINDINGS: The lung bases exhibit patchy interstitial densities with small bilateral pleural effusions. Within the abdomen the colonic stool burden is increased. There no small or large bowel obstructive pattern. There are degenerative changes of both SI joints. There is multilevel degenerative disc disease of the lumbar spine. There is calcification within visceral arteries. IMPRESSION: Increase colonic stool burden may reflect constipation.  No evidence of a fecal impaction or small bowel obstruction. Bibasilar atelectasis or  pneumonia with small right pleural effusion and trace left pleural effusion. Intra-abdominal arterial atherosclerosis. Electronically Signed   By: David  [place] M.D.   On: 10/01/2017 08:16    ASSESSMENT AND PLAN:  1acute on chronic hypoxic respiratory failure Resolved Secondary to acute on chronic congestive heart failure exacerbation >> CAP Did require ICU care with BiPAP during initial hospital stay-successfully weaned off, currently at baseline O2 requirement of 2 L continuous, in discussion with physical therapy-we will need skilled nursing facility placement, discontinue Foley   2acute probable community-acquired pneumonia  Resolving Continue Levaquin for 5-day course   3acute on chronic diastolic congestive heart failure exacerbation  Resolved  Treated with IV Lasix initially, converted to p.o. Lasix on October 01, 2017, continue congestive heart failure protocol  With plans for discharge to skilled nursing facility tentatively planned for on tomorrow   4CKD stage III Stable  Avoid nephrotoxic agents  5chronic dementia  Stable  Continue increased nursing care, aspiration/fall/skin care precautions  Disposition to skilled nursing facility in the next 1-2 days barring any complications  All the records are reviewed and case discussed with Care Management/Social Workerr. Management plans discussed with the patient, family and they are in agreement.  CODE STATUS: dnr  TOTAL TIME TAKING CARE OF THIS PATIENT: 45 minutes.     POSSIBLE D/C IN 1-2 DAYS, DEPENDING ON CLINICAL CONDITION.   Evelena Asa Dorcus Riga M.D on 10/01/2017   Between 7am to 6pm - Pager - [phone]  After 6pm go to [email or web address] - Social research officer, government  [facility] [place] [facility]  Office  [phone]  CC: Primary care physician; Lauro Regulus, MD  Note: This dictation was prepared with Dragon  dictation along with smaller phrase technology. Any transcriptional errors that result from this process are unintentional.

## 2017-10-01 NOTE — Plan of Care (Signed)
Monitor and apply barrier cream to buttocks to prevent breakdown. Administered prune juice to assist with bowel movement, no bowel movement this shift.

## 2017-10-01 NOTE — Progress Notes (Signed)
Pt voided incont. Ss enema given no results of stool noted just solution returned. Reported to oncoming shift marcel rn

## 2017-10-01 NOTE — [place] FL2 (Signed)
[place] MEDICAID FL2 LEVEL OF CARE SCREENING TOOL     IDENTIFICATION  Patient Name: Cassandra DanielMargie Mccall Birthdate: March 11, 1929 Sex: female Admission Date (Current Location): 09/27/2017  [place]   Facility and Address:  [facility], [address], [place], [place] [address]      Provider Number: [id number]  Attending Physician Name and Address:  Bertrum SolSalary, Montell D, MD  Relative Name and Phone Number:       Current Level of Care: Hospital Recommended Level of Care: Skilled Nursing Facility Prior Approval Number:    Date Approved/Denied:   PASRR Number: [id number])  Discharge Plan: SNF    Current Diagnoses: Patient Active Problem List   Diagnosis Date Noted  . Pneumonia 09/27/2017  . Elevated troponin I level   . Respiratory distress   . Alzheimer disease   . Chronic kidney disease (CKD), stage IV (severe) (HCC)   . Palliative care encounter   . Acute respiratory failure with hypoxia (HCC) 08/15/2017    Orientation RESPIRATION BLADDER Height & Weight     Self, Place  O2(2.5 Liters ) Continent Weight: 150 lb 11.2 oz (68.4 kg) Height:  5\' 3"  (160 cm)  BEHAVIORAL SYMPTOMS/MOOD NEUROLOGICAL BOWEL NUTRITION STATUS      Continent Diet(Diet: Soft. )  AMBULATORY STATUS COMMUNICATION OF NEEDS Skin   Extensive Assist Verbally Normal                       Personal Care Assistance Level of Assistance  Bathing, Feeding, Dressing Bathing Assistance: Limited assistance Feeding assistance: Independent Dressing Assistance: Limited assistance     Functional Limitations Info  Sight, Hearing, Speech Sight Info: Impaired Hearing Info: Impaired Speech Info: Adequate    SPECIAL CARE FACTORS FREQUENCY  PT (By licensed PT), OT (By licensed OT)     PT Frequency: (5) OT Frequency: (5)            Contractures      Additional Factors Info  Code Status, Allergies Code Status Info: (DNR ) Allergies Info:  (Statins, Sulfa Antibiotics)           Current Medications (10/01/2017):  This is the current hospital active medication list Current Facility-Administered Medications  Medication Dose Route Frequency Provider Last Rate Last Dose  . acetaminophen (TYLENOL) tablet 650 mg  650 mg Oral Q6H PRN Arnaldo Nataliamond, Michael S, MD       Or  . acetaminophen (TYLENOL) suppository 650 mg  650 mg Rectal Q6H PRN Arnaldo Nataliamond, Michael S, MD      . albuterol (PROVENTIL) (2.5 MG/3ML) 0.083% nebulizer solution 2.5 mg  2.5 mg Nebulization Q4H PRN Arnaldo Nataliamond, Michael S, MD      . ALPRAZolam Prudy Feeler(XANAX) tablet 0.125-0.25 mg  0.125-0.25 mg Oral TID PRN Arnaldo Nataliamond, Michael S, MD   0.25 mg at 09/29/17 0842  . amLODipine (NORVASC) tablet 2.5 mg  2.5 mg Oral Daily Arnaldo Nataliamond, Michael S, MD   2.5 mg at 10/01/17 1348  . aspirin EC tablet 81 mg  81 mg Oral Daily Arnaldo Nataliamond, Michael S, MD   81 mg at 10/01/17 1347  . atorvastatin (LIPITOR) tablet 80 mg  80 mg Oral q1800 Arnaldo Nataliamond, Michael S, MD   80 mg at 09/30/17 2133  . chlorhexidine (PERIDEX) 0.12 % solution 15 mL  15 mL Mouth Rinse BID Conforti, John, DO   15 mL at 10/01/17 1347  . cholecalciferol (VITAMIN D) tablet 2,000 Units  2,000 Units Oral Daily Arnaldo Nataliamond, Michael S, MD  2,000 Units at 10/01/17 1347  . clopidogrel (PLAVIX) tablet 75 mg  75 mg Oral Daily Arnaldo Natal, MD   75 mg at 10/01/17 1347  . docusate sodium (COLACE) capsule 100 mg  100 mg Oral BID Arnaldo Natal, MD   100 mg at 10/01/17 1347  . famotidine (PEPCID) tablet 20 mg  20 mg Oral Daily Arnaldo Natal, MD   20 mg at 10/01/17 1347  . feeding supplement (ENSURE ENLIVE) (ENSURE ENLIVE) liquid 237 mL  237 mL Oral BID BM Katha Hamming, MD   237 mL at 09/27/17 1538  . [START ON 10/02/2017] furosemide (LASIX) tablet 40 mg  40 mg Oral Daily Salary, Montell D, MD      . guaiFENesin (MUCINEX) 12 hr tablet 600 mg  600 mg Oral BID Arnaldo Natal, MD   600 mg at 10/01/17 1347  . guaiFENesin-dextromethorphan (ROBITUSSIN  DM) 100-10 MG/5ML syrup 5 mL  5 mL Oral Q4H PRN Arnaldo Natal, MD      . heparin injection 5,000 Units  5,000 Units Subcutaneous Q8H Arnaldo Natal, MD   5,000 Units at 10/01/17 1633  . levofloxacin (LEVAQUIN) IVPB 500 mg  500 mg Intravenous Q48H Arnaldo Natal, MD   Stopped at 09/30/17 1800  . MEDLINE mouth rinse  15 mL Mouth Rinse q12n4p Conforti, John, DO   15 mL at 10/01/17 1634  . memantine (NAMENDA) tablet 10 mg  10 mg Oral BID Arnaldo Natal, MD   10 mg at 10/01/17 1347  . metoCLOPramide (REGLAN) injection 10 mg  10 mg Intravenous TID AC & HS Salary, Montell D, MD      . metoprolol succinate (TOPROL-XL) 24 hr tablet 25 mg  25 mg Oral Daily Arnaldo Natal, MD   25 mg at 10/01/17 1347  . mometasone-formoterol (DULERA) 200-5 MCG/ACT inhaler 2 puff  2 puff Inhalation BID Arnaldo Natal, MD   2 puff at 10/01/17 [phone]  . multivitamin with minerals tablet 1 tablet  1 tablet Oral Daily Katha Hamming, MD   1 tablet at 10/01/17 1348  . ondansetron (ZOFRAN) tablet 4 mg  4 mg Oral Q6H PRN Arnaldo Natal, MD       Or  . ondansetron [facility]) injection 4 mg  4 mg Intravenous Q6H PRN Arnaldo Natal, MD   4 mg at 09/30/17 1601  . patiromer Lelon Perla) packet 8.4 g  8.4 g Oral Daily Salary, Montell D, MD      . Melene Muller ON 10/02/2017] predniSONE (DELTASONE) tablet 50 mg  50 mg Oral Q breakfast Salary, Montell D, MD      . sodium chloride (OCEAN) 0.65 % nasal spray 2 spray  2 spray Each Nare PRN Arnaldo Natal, MD      . sodium chloride flush (NS) 0.9 % injection 10-40 mL  10-40 mL Intracatheter PRN Katha Hamming, MD      . sodium chloride flush (NS) 0.9 % injection 10-40 mL  10-40 mL Intracatheter Q12H Katha Hamming, MD   10 mL at 10/01/17 1636  . tiotropium (SPIRIVA) inhalation capsule 18 mcg  18 mcg Inhalation Daily Arnaldo Natal, MD   18 mcg at 10/01/17 9604     Discharge Medications: Please see discharge summary for a list of discharge  medications.  Relevant Imaging Results:  Relevant Lab Results:   Additional Information (SSN: [id number])  Quinnley Colasurdo, Darleen Crocker, LCSW

## 2017-10-01 NOTE — Progress Notes (Signed)
Foley d/c'd

## 2017-10-01 NOTE — Progress Notes (Signed)
Physical Therapy Treatment Patient Details Name: Cassandra Mccall MRN: [id number] DOB: [birth year] Today's Date: 10/01/2017    History of Present Illness 84 yo female with CHF and pulm edema was hypoxic and admitted with poss but doubtful PNA.  PMHx:  CHF, PNA, UTI, MI, PVD, ALZ, PAD, COPD, smoker, bronchitis, HTN, DM, osteopenia, abnormal liver fn, chronic respiratory failure    PT Comments    Pt agreeable to PT; denies pain. Pt poor historian, but responds to present situation well. Requires increased verbal and tactile cueing to accomplish tasks, but successful with increased time/instruction and cues. Pt demonstrates Mod to Min A for bed mobility tasks, Min A for STS transfers and Min A for ambulation bed to chair. Pt received up in chair comfortably. Recommend discharge post acute care stay to skilled nursing facility to progress strength, endurance to improve all functional mobility tasks.    Follow Up Recommendations  SNF     Equipment Recommendations  Other (comment)(has rw )    Recommendations for Other Services       Precautions / Restrictions Precautions Precautions: Fall Restrictions Weight Bearing Restrictions: No    Mobility  Bed Mobility Overal bed mobility: Needs Assistance Bed Mobility: Supine to Sit     Supine to sit: Mod assist;Min assist     General bed mobility comments: Mod A to scoot hips to edge of bed in supine; min A for mobilizing LE to/over edge of bed and once sitting able to scoot hip to edge of bed with increased time and cues  Transfers Overall transfer level: Needs assistance Equipment used: Rolling walker (2 wheeled) Transfers: Sit to/from Stand Sit to Stand: Min assist         General transfer comment: Performed twiceuses hands on rw despite cues, but this is her muscle memory/habit; performs safely with Min A otherwise  Ambulation/Gait Ambulation/Gait assistance: Min guard; Min A Ambulation Distance (Feet): 3 Feet Assistive device:  Rolling walker (2 wheeled) Gait Pattern/deviations: Step-to pattern;Trunk flexed   Gait velocity interpretation: <1.8 ft/sec, indicative of risk for recurrent falls General Gait Details: Assist to maneuver rw, but steps well bed to chair several feet   Stairs            Wheelchair Mobility    Modified Rankin (Stroke Patients Only)       Balance Overall balance assessment: Needs assistance Sitting-balance support: Feet supported;Bilateral upper extremity supported Sitting balance-Leahy Scale: Fair     Standing balance support: Bilateral upper extremity supported Standing balance-Leahy Scale: Fair                              Cognition Arousal/Alertness: Awake/alert Behavior During Therapy: WFL for tasks assessed/performed;Impulsive Overall Cognitive Status: No family/caregiver present to determine baseline cognitive functioning                                 General Comments: slow to process; requires additional verbal/tactile cues to accomplish tasks      Exercises      General Comments        Pertinent Vitals/Pain Pain Assessment: No/denies pain    Home Living                      Prior Function            PT Goals (current goals can now be found in the care  plan section) Progress towards PT goals: Progressing toward goals    Frequency    Min 2X/week      PT Plan Current plan remains appropriate    Co-evaluation              AM-PAC PT "6 Clicks" Daily Activity  Outcome Measure  Difficulty turning over in bed (including adjusting bedclothes, sheets and blankets)?: Unable Difficulty moving from lying on back to sitting on the side of the bed? : Unable Difficulty sitting down on and standing up from a chair with arms (e.g., wheelchair, bedside commode, etc,.)?: Unable Help needed moving to and from a bed to chair (including a wheelchair)?: A Little Help needed walking in hospital room?: A  Little Help needed climbing 3-5 steps with a railing? : A Lot 6 Click Score: 11    End of Session Equipment Utilized During Treatment: Gait belt;Oxygen Activity Tolerance: Patient tolerated treatment well Patient left: in chair;with call bell/phone within reach;with chair alarm set Nurse Communication: Other (comment)(SW updated) PT Visit Diagnosis: Unsteadiness on feet (R26.81);Other abnormalities of gait and mobility (R26.89);Repeated falls (R29.6);Muscle weakness (generalized) (M62.81);History of falling (Z91.81);Ataxic gait (R26.0);Apraxia (R48.2);Other symptoms and signs involving the nervous system (R29.898);Difficulty in walking, not elsewhere classified (R26.2);BPPV;Adult, failure to thrive (R62.7);Dizziness and giddiness (R42);Pain;Other (comment);Hemiplegia and hemiparesis     Time: 1208-1232 PT Time Calculation (min) (ACUTE ONLY): 24 min  Charges:  $Gait Training: 8-22 mins $Therapeutic Activity: 8-22 mins                    G Codes:        Scot Dock, PTA 10/01/2017, 12:44 PM

## 2017-10-01 NOTE — Clinical Social Work Note (Signed)
Clinical Social Work Assessment  Patient Details  Name: Cassandra Mccall MRN: [id number] Date of Birth: 1929/02/21  Date of referral:  10/01/17               Reason for consult:  Facility Placement                Permission sought to share information with:  Chartered certified accountant granted to share information::  Yes, Verbal Permission Granted  Name::      [place]::   [place]   Relationship::     Contact Information:     Housing/Transportation Living arrangements for the past 2 months:  [place], [place] of Information:  Patient, Adult Children Patient Interpreter Needed:  None Criminal Activity/Legal Involvement Pertinent to Current Situation/Hospitalization:  No - Comment as needed Significant Relationships:  Adult Children Lives with:  Adult Children Do you feel safe going back to the place where you live?  Yes Need for family participation in patient care:  Yes (Comment)  Care giving concerns:  Patient lives in [place] with her daughter Cassandra Mccall.    Social Worker assessment / plan:  Holiday representative (CSW) reviewed chart and noted that PT is recommending SNF. CSW met with patient while PT was at bedside. Patient was oriented to person only. CSW introduced self and explained role of CSW department. Patient reported that she lives with her daughter Cassandra Mccall and is agreeable to SNF. CSW contacted patient's daughter Cassandra Mccall. Per daughter patient was recently at [facility] and discharged home on 09/10/17. Per Cassandra Mccall patient stayed at [facility] for about 3 weeks. CSW explained that PT is recommending SNF and medicare requires a 3 night inpatient qualfying stay in a hospital in order to pay for SNF. CSW also explained that patient will be in her co-pay days because she has not had a 60 day wellness period out of the hospital and out of rehab. Daughter reported that patient's [employer] supplemental insurance will not pay  for SNF. Per [facility] admissions coordinator at [facility] will not pay for co-pays and they can't make a bed offer. Daughter stated that she will have to pay the co-pays because she needs SNF. FL2 complete and faxed out. CSW will continue to follow and assist as needed.   Employment status:  Disabled (Comment on whether or not currently receiving Disability), Retired Forensic scientist:  Medicare PT Recommendations:  [place] / Referral to community resources:  [place]  Patient/Family's Response to care:  Patient and her daughter are agreeable to SNF search in [place].   Patient/Family's Understanding of and Emotional Response to Diagnosis, Current Treatment, and Prognosis:  Patient and her daughter were very pleasant and thanked CSW for assistance.   Emotional Assessment Appearance:  Appears stated age Attitude/Demeanor/Rapport:    Affect (typically observed):  Pleasant Orientation:  Oriented to Self, Oriented to Place, Fluctuating Orientation (Suspected and/or reported Sundowners) Alcohol / Substance use:  Not Applicable Psych involvement (Current and /or in the community):  No (Comment)  Discharge Needs  Concerns to be addressed:  Discharge Planning Concerns Readmission within the last 30 days:  No Current discharge risk:  Chronically ill, Cognitively Impaired, Dependent with Mobility Barriers to Discharge:  Continued Medical Work up   [employer], Veronia Beets, LCSW 10/01/2017, 7:29 PM

## 2017-10-01 NOTE — Clinical Social Work Placement (Signed)
   CLINICAL SOCIAL WORK PLACEMENT  NOTE  Date:  10/01/2017  Patient Details  Name: Cassandra DanielMargie Rivest MRN: [id number] Date of Birth: [birth year]  Clinical Social Work is seeking post-discharge placement for this patient at the Skilled  Nursing Facility level of care (*CSW will initial, date and re-position this form in  chart as items are completed):  Yes   Patient/family provided with [facility] Clinical Social Work Department's list of facilities offering this level of care within the geographic area requested by the patient (or if unable, by the patient's family).  Yes   Patient/family informed of their freedom to choose among providers that offer the needed level of care, that participate in Medicare, Medicaid or managed care program needed by the patient, have an available bed and are willing to accept the patient.  Yes   Patient/family informed of [facility]'s ownership interest in [facility] and [facility], as well as of the fact that they are under no obligation to receive care at these facilities.  PASRR submitted to EDS on       PASRR number received on       Existing PASRR number confirmed on 10/01/17     FL2 transmitted to all facilities in geographic area requested by pt/family on 10/01/17     FL2 transmitted to all facilities within larger geographic area on       Patient informed that his/her managed care company has contracts with or will negotiate with certain facilities, including the following:            Patient/family informed of bed offers received.  Patient chooses bed at       Physician recommends and patient chooses bed at      Patient to be transferred to   on  .  Patient to be transferred to facility by       Patient family notified on   of transfer.  Name of family member notified:        PHYSICIAN       Additional Comment:    _______________________________________________ Nathen Balaban, Darleen CrockerBailey M, LCSW 10/01/2017, 7:27 PM

## 2017-10-01 NOTE — Plan of Care (Signed)
  Progressing Education: Knowledge of General Education information will improve 10/01/2017 2003 - Progressing by Luretha MurphyMiles, Mattew Chriswell, RN Health Behavior/Discharge Planning: Ability to manage health-related needs will improve 10/01/2017 2003 - Progressing by Luretha MurphyMiles, Jailine Lieder, RN Clinical Measurements: Ability to maintain clinical measurements within normal limits will improve 10/01/2017 2003 - Progressing by Luretha MurphyMiles, Lezly Rumpf, RN Will remain free from infection 10/01/2017 2003 - Progressing by Luretha MurphyMiles, Addylynn Balin, RN Diagnostic test results will improve 10/01/2017 2003 - Progressing by Luretha MurphyMiles, Hala Narula, RN Respiratory complications will improve 10/01/2017 2003 - Progressing by Luretha MurphyMiles, Lovina Zuver, RN Cardiovascular complication will be avoided 10/01/2017 2003 - Progressing by Luretha MurphyMiles, Cova Knieriem, RN Activity: Risk for activity intolerance will decrease 10/01/2017 2003 - Progressing by Luretha MurphyMiles, Rojean Ige, RN Nutrition: Adequate nutrition will be maintained 10/01/2017 2003 - Progressing by Luretha MurphyMiles, Antoinette Haskett, RN Coping: Level of anxiety will decrease 10/01/2017 2003 - Progressing by Luretha MurphyMiles, Azalynn Maxim, RN Elimination: Will not experience complications related to urinary retention 10/01/2017 2003 - Progressing by Luretha MurphyMiles, Eri Platten, RN Pain Managment: General experience of comfort will improve 10/01/2017 2003 - Progressing by Luretha MurphyMiles, Valora Norell, RN Safety: Ability to remain free from injury will improve 10/01/2017 2003 - Progressing by Luretha MurphyMiles, Chioma Mukherjee, RN Skin Integrity: Risk for impaired skin integrity will decrease 10/01/2017 2003 - Progressing by Luretha MurphyMiles, Renny Gunnarson, RN

## 2017-10-02 MED ORDER — ALPRAZOLAM 0.25 MG PO TABS: 0.1250 mg | ORAL_TABLET | Freq: Two times a day (BID) | ORAL | Status: DC | PRN

## 2017-10-02 MED ORDER — MAGNESIUM HYDROXIDE 400 MG/5ML PO SUSP
45.0000 mL | Freq: Once | ORAL | Status: AC
  Administered 2017-10-02: 45 mL via ORAL
  Filled 2017-10-02: qty 60

## 2017-10-02 MED ORDER — BISACODYL 10 MG RE SUPP
10.0000 mg | Freq: Once | RECTAL | Status: AC
  Administered 2017-10-02: 10 mg via RECTAL
  Filled 2017-10-02: qty 1

## 2017-10-02 MED ORDER — LACTULOSE 10 GM/15ML PO SOLN
30.0000 g | Freq: Two times a day (BID) | ORAL | Status: DC
  Administered 2017-10-02 (×2): 30 g via ORAL
  Filled 2017-10-02 (×2): qty 60

## 2017-10-02 MED ORDER — MEGESTROL ACETATE 400 MG/10ML PO SUSP
400.0000 mg | Freq: Every day | ORAL | Status: DC
  Administered 2017-10-02: 400 mg via ORAL
  Filled 2017-10-02 (×2): qty 10

## 2017-10-02 NOTE — Progress Notes (Signed)
Pt had large bm this late pm.  tol dinner fair  But spit up small amt  Food after eating dinner

## 2017-10-02 NOTE — Care Management Important Message (Signed)
Important Message  Patient Details  Name: Cassandra DanielMargie Mccall MRN: [id number] Date of Birth: September 24, 1928   Medicare Important Message Given:  Yes    Gwenette GreetBrenda S Fynlee Rowlands, RN 10/02/2017, 7:14 AM

## 2017-10-02 NOTE — Progress Notes (Signed)
Physical Therapy Treatment Patient Details Name: Cassandra Mccall MRN: [id number] DOB: June 06, 1929 Today's Date: 10/02/2017    History of Present Illness 84 yo female with CHF and pulm edema was hypoxic and admitted with poss but doubtful PNA.  PMHx:  CHF, PNA, UTI, MI, PVD, ALZ, PAD, COPD, smoker, bronchitis, HTN, DM, osteopenia, abnormal liver fn, chronic respiratory failure    PT Comments    Pt is able to participate in a limited way secondary to needing excessive and explicit cuing for even the most basic acts.  Pt seems willing, but ultimately struggles with even very basic and straight forward cuing for simple tasks.  Pt needed excess time to perform even single step movements/exercises/single step/etc.  Daughter present t/o the session and though she is adamant bout taking mother home she is cautious as she reports pt's mental status is profoundly different than it was just a few months ago when she was first hospitalized.    Follow Up Recommendations  Home health PT;Supervision/Assistance - 24 hour(daughter reports SNF is not an option, will get help at home)     Equipment Recommendations       Recommendations for Other Services       Precautions / Restrictions Precautions Precautions: Fall Restrictions Weight Bearing Restrictions: No    Mobility  Bed Mobility Overal bed mobility: Needs Assistance Bed Mobility: Supine to Sit     Supine to sit: Mod assist Sit to supine: Mod assist;Max assist   General bed mobility comments: Pt likely could have phyiscally done more but simply was too confused and unable to process simple single step commands to get to/from supine  Transfers Overall transfer level: Needs assistance Equipment used: Rolling walker (2 wheeled) Transfers: Sit to/from Stand Sit to Stand: Min assist         General transfer comment: Pt able to rise to standing with walker and excessive cuing/encouragement in order for her to complete requested  task.  Ambulation/Gait Ambulation/Gait assistance: Min assist Ambulation Distance (Feet): 5 Feet Assistive device: Rolling walker (2 wheeled)       General Gait Details: Pt able to do a few steps forward and back X 2 along with some side stepping along EOB, however pt very slow to follow instructions to even put a full R/L step back to back. Pt extremely slow with all standing tasks and needing excessive verbal as well as considerable tactile cuing to perform.  Pt on O2 with sats dropping from 98% to 93% during light standing activity.   Stairs            Wheelchair Mobility    Modified Rankin (Stroke Patients Only)       Balance Overall balance assessment: Needs assistance   Sitting balance-Leahy Scale: Fair       Standing balance-Leahy Scale: Fair Standing balance comment: definite need of UEs on AD, poor awareness/ability to follow cuing                            Cognition Arousal/Alertness: Awake/alert Behavior During Therapy: Flat affect Overall Cognitive Status: Impaired/Different from baseline Area of Impairment: Safety/judgement;Awareness;Problem solving;Following commands;Orientation                               General Comments: slow to process; requires additional verbal/tactile cues to accomplish tasks      Exercises General Exercises - Lower Extremity Ankle Circles/Pumps: AAROM;10 reps Heel  Slides: AAROM;10 reps Hip ABduction/ADduction: AAROM;10 reps Straight Leg Raises: AAROM;10 reps    General Comments        Pertinent Vitals/Pain Pain Assessment: No/denies pain    Home Living                      Prior Function            PT Goals (current goals can now be found in the care plan section) Acute Rehab PT Goals Potential to Achieve Goals: Poor Progress towards PT goals: Progressing toward goals    Frequency    Min 2X/week      PT Plan Current plan remains appropriate    Co-evaluation               AM-PAC PT "6 Clicks" Daily Activity  Outcome Measure  Difficulty turning over in bed (including adjusting bedclothes, sheets and blankets)?: Unable Difficulty moving from lying on back to sitting on the side of the bed? : Unable Difficulty sitting down on and standing up from a chair with arms (e.g., wheelchair, bedside commode, etc,.)?: Unable Help needed moving to and from a bed to chair (including a wheelchair)?: A Little Help needed walking in hospital room?: A Lot Help needed climbing 3-5 steps with a railing? : Total 6 Click Score: 9    End of Session Equipment Utilized During Treatment: Gait belt;Oxygen Activity Tolerance: Patient limited by fatigue;Patient tolerated treatment well Patient left: with bed alarm set;with call bell/phone within reach;with family/visitor present(daughter reports pt was v. sore after sitting yesterday)   PT Visit Diagnosis: Unsteadiness on feet (R26.81);Other abnormalities of gait and mobility (R26.89);Repeated falls (R29.6);Muscle weakness (generalized) (M62.81);History of falling (Z91.81);Ataxic gait (R26.0);Apraxia (R48.2);Other symptoms and signs involving the nervous system (R29.898);Difficulty in walking, not elsewhere classified (R26.2);BPPV;Adult, failure to thrive (R62.7);Dizziness and giddiness (R42);Pain;Other (comment);Hemiplegia and hemiparesis     Time: 1610-9604 PT Time Calculation (min) (ACUTE ONLY): 42 min  Charges:  $Gait Training: 8-22 mins $Therapeutic Exercise: 8-22 mins $Therapeutic Activity: 8-22 mins                    G Codes:       Malachi Pro, DPT 10/02/2017, 5:08 PM

## 2017-10-02 NOTE — Plan of Care (Signed)
  Progressing Clinical Measurements: Ability to maintain clinical measurements within normal limits will improve 10/02/2017 1312 - Progressing by Luretha MurphyMiles, Amirrah Quigley, RN Will remain free from infection 10/02/2017 1312 - Progressing by Luretha MurphyMiles, Lakshya Mcgillicuddy, RN Diagnostic test results will improve 10/02/2017 1312 - Progressing by Luretha MurphyMiles, Heyward Douthit, RN Respiratory complications will improve 10/02/2017 1312 - Progressing by Luretha MurphyMiles, Nathanel Tallman, RN Note Pt on  02 2l  this is chronic as pt on this at home Cardiovascular complication will be avoided 10/02/2017 1312 - Progressing by Luretha MurphyMiles, Blythe Hartshorn, RN Activity: Risk for activity intolerance will decrease 10/02/2017 1312 - Progressing by Luretha MurphyMiles, Kaniel Kiang, RN Coping: Level of anxiety will decrease 10/02/2017 1312 - Progressing by Luretha MurphyMiles, Arther Heisler, RN Elimination: Will not experience complications related to urinary retention 10/02/2017 1312 - Progressing by Luretha MurphyMiles, Doroteo Nickolson, RN Safety: Ability to remain free from injury will improve 10/02/2017 1312 - Progressing by Luretha MurphyMiles, Nethan Caudillo, RN Skin Integrity: Risk for impaired skin integrity will decrease 10/02/2017 1312 - Progressing by Luretha MurphyMiles, Grover Robinson, RN Activity: Ability to tolerate increased activity will improve 10/02/2017 1312 - Progressing by Luretha MurphyMiles, Hadlei Stitt, RN Clinical Measurements: Ability to maintain a body temperature in the normal range will improve 10/02/2017 1312 - Progressing by Luretha MurphyMiles, Gustavus Haskin, RN Respiratory: Ability to maintain adequate ventilation will improve 10/02/2017 1312 - Progressing by Luretha MurphyMiles, Munachimso Palin, RN

## 2017-10-02 NOTE — Progress Notes (Signed)
[facility] - [facility]   PATIENT NAME: Cassandra Mccall    MR#:  [id number]  DATE OF BIRTH:  05/17/1929  SUBJECTIVE:  CHIEF COMPLAINT:   Chief Complaint  Patient presents with  . Respiratory Distress  Patient is poor historian due to dementia, per daughter patient is not eating, has not had a bowel movement, the patient's daughter wants her to come home-not interested in skilled nursing facility placement, the patient's daughter does not want to take the patient home given that she is not eating, is not keeping food down/has not eaten in over a week, and does not feel comfortable taking patient home  REVIEW OF SYSTEMS:  CONSTITUTIONAL: No fever, fatigue or weakness.  EYES: No blurred or double vision.  EARS, NOSE, AND THROAT: No tinnitus or ear pain.  RESPIRATORY: No cough, shortness of breath, wheezing or hemoptysis.  CARDIOVASCULAR: No chest pain, orthopnea, edema.  GASTROINTESTINAL: No nausea, vomiting, diarrhea or abdominal pain.  GENITOURINARY: No dysuria, hematuria.  ENDOCRINE: No polyuria, nocturia,  HEMATOLOGY: No anemia, easy bruising or bleeding SKIN: No rash or lesion. MUSCULOSKELETAL: No joint pain or arthritis.   NEUROLOGIC: No tingling, numbness, weakness.  PSYCHIATRY: No anxiety or depression.   ROS  DRUG ALLERGIES:   Allergies  Allergen Reactions  . Statins     Other reaction(s): Muscle Pain Unclear if took  . Sulfa Antibiotics     VITALS:  Blood pressure (!) 148/50, pulse 79, temperature 98.2 F (36.8 C), temperature source Oral, resp. rate 20, height 5\' 3"  (1.6 m), weight 68.7 kg (151 lb 7.3 oz), SpO2 100 %.  PHYSICAL EXAMINATION:  GENERAL:  84 y.o.-year-old patient lying in the bed with no acute distress.  EYES: Pupils equal, round, reactive to light and accommodation. No scleral icterus. Extraocular muscles intact.  HEENT: Head atraumatic, normocephalic. Oropharynx and nasopharynx clear.  NECK:  Supple, no jugular venous  distention. No thyroid enlargement, no tenderness.  LUNGS: Normal breath sounds bilaterally, no wheezing, rales,rhonchi or crepitation. No use of accessory muscles of respiration.  CARDIOVASCULAR: S1, S2 normal. No murmurs, rubs, or gallops.  ABDOMEN: Soft, nontender, nondistended. Bowel sounds present. No organomegaly or mass.  EXTREMITIES: No pedal edema, cyanosis, or clubbing.  NEUROLOGIC: Cranial nerves II through XII are intact. Muscle strength 5/5 in all extremities. Sensation intact. Gait not checked.  PSYCHIATRIC: The patient is alert and oriented x 3.  SKIN: No obvious rash, lesion, or ulcer.   Physical Exam LABORATORY PANEL:   CBC Recent Labs  Lab 09/27/17 0237  WBC 11.6*  HGB 10.7*  HCT 33.2*  PLT 205   ------------------------------------------------------------------------------------------------------------------  Chemistries  Recent Labs  Lab 10/01/17 0320  NA 138  K 5.2*  CL 104  CO2 23  GLUCOSE 176*  BUN 71*  CREATININE 2.58*  CALCIUM 8.9   ------------------------------------------------------------------------------------------------------------------  Cardiac Enzymes Recent Labs  Lab 09/27/17 0237  TROPONINI 0.05*   ------------------------------------------------------------------------------------------------------------------  RADIOLOGY:  Dg Abd 2 Views  Result Date: 10/01/2017 CLINICAL DATA:  Vomiting, increasing shortness of breath. History of CHF, COPD, former smoker, coronary artery disease, and diabetes. EXAM: ABDOMEN - 2 VIEW COMPARISON:  AP pelvis of September 27, 2017 FINDINGS: The lung bases exhibit patchy interstitial densities with small bilateral pleural effusions. Within the abdomen the colonic stool burden is increased. There no small or large bowel obstructive pattern. There are degenerative changes of both SI joints. There is multilevel degenerative disc disease of the lumbar spine. There is calcification within visceral arteries.  IMPRESSION: Increase  colonic stool burden may reflect constipation. No evidence of a fecal impaction or small bowel obstruction. Bibasilar atelectasis or pneumonia with small right pleural effusion and trace left pleural effusion. Intra-abdominal arterial atherosclerosis. Electronically Signed   By: David  SwazilandJordan M.D.   On: 10/01/2017 08:16    ASSESSMENT AND PLAN:  1acute on chronic hypoxic respiratory failure Resolved Secondary toacute on chronic congestive heart failure exacerbation>> CAP Did require ICU care with BiPAP during initial hospital stay-successfully weaned off, currently at baseline O2 requirement of 2 L continuous, in discussion with physical therapy-we will need SNF but the patient's daughter does not want her placed-wants to take patient home but only after the patient has a bowel movement/patient eating food/and no further episodes of emesis    2acute probable community-acquired pneumonia Resolving ContinueLevaquin for 5-day course  3acute on chronic diastolic congestive heart failure exacerbation  Resolved Treated with IV Lasix initially, converted to p.o. Lasix on October 01, 2017, continue CHF protocol   4CKD stage III Stable Avoidnephrotoxic agents  5chronic dementia  Stable Continue increasednursing care,aspiration/fall/skin care precautions  6 acute constipation Lactulose twice daily and Dulcolax suppository x1 Abdominal x-rays noted for significant constipation  7 acute nausea/emesis Most likely secondary to significant constipation Continue antiemetics, lactulose/suppository as stated above, check abdominal x-ray in the morning  8 acute on chronic poor appetite Megace twice daily    All the records are reviewed and case discussed with Care Management/Social Workerr. Management plans discussed with the patient, family and they are in agreement.  CODE STATUS: dnr  TOTAL TIME TAKING CARE OF THIS PATIENT: 35 minutes.      POSSIBLE D/C IN 1-2 DAYS, DEPENDING ON CLINICAL CONDITION.   Evelena AsaMontell D Salary M.D on 10/02/2017   Between 7am to 6pm - Pager - [phone]  After 6pm go to [email or web address] - Social research officer, governmentpassword EPAS [email or web address]  [facility]  Office  [phone]  CC: Primary care physician; Lauro RegulusAnderson, Marshall W, MD  Note: This dictation was prepared with Dragon dictation along with smaller phrase technology. Any transcriptional errors that result from this process are unintentional.

## 2017-10-02 NOTE — Progress Notes (Signed)
Clinical Social Worker (CSW) contacted patient's daughter Clydie BraunKaren to present bed offers. Clydie BraunKaren reported that she discussed it with her brother and since Monia Pouchetna does not pay the co-pays she will take patient home with home health. Per daughter patient was already working with [facility]. Daughter asked if they change their minds about SNF can patient be placed from home. CSW explained that patient has a 30 day window to use her 3 night inpatient stay to go to SNF and can be placed from home only within 30 days from discharging from the hospital. Daughter reported that she wanted patient to have a BM and be able to eat prior to D/C. CSW made RN case manager aware of above and paged MD. Please reconsult if future social work needs arise. CSW signing off.   Baker Hughes IncorporatedBailey Tomeeka Plaugher, LCSW [phone]

## 2017-10-03 ENCOUNTER — Inpatient Hospital Stay: Payer: Medicare Other

## 2017-10-03 LAB — BASIC METABOLIC PANEL
ANION GAP: 12 (ref 5–15)
BUN: 79 mg/dL — ABNORMAL HIGH (ref 6–20)
CHLORIDE: 103 mmol/L (ref 101–111)
CO2: 26 mmol/L (ref 22–32)
CREATININE: 2.94 mg/dL — AB (ref 0.44–1.00)
Calcium: 8.8 mg/dL — ABNORMAL LOW (ref 8.9–10.3)
GFR calc non Af Amer: 13 mL/min — ABNORMAL LOW (ref >60)
GFR, EST AFRICAN AMERICAN: 16 mL/min — AB (ref >60)
Glucose, Bld: 238 mg/dL — ABNORMAL HIGH (ref 65–99)
Potassium: 4.8 mmol/L (ref 3.5–5.1)
SODIUM: 141 mmol/L (ref 135–145)

## 2017-10-03 LAB — MAGNESIUM: Magnesium: 2.9 mg/dL — ABNORMAL HIGH (ref 1.7–2.4)

## 2017-10-03 LAB — ALBUMIN: Albumin: 2.9 g/dL — ABNORMAL LOW (ref 3.5–5.0)

## 2017-10-03 LAB — PHOSPHORUS: PHOSPHORUS: 3.6 mg/dL (ref 2.5–4.6)

## 2017-10-03 MED ORDER — FUROSEMIDE 20 MG PO TABS: 20.0000 mg | ORAL_TABLET | Freq: Every day | ORAL | 0 refills | Status: DC | PRN

## 2017-10-03 MED ORDER — ENSURE ENLIVE PO LIQD: 237.0000 mL | Freq: Two times a day (BID) | ORAL | 12 refills | Status: AC

## 2017-10-03 NOTE — Care Management (Signed)
Discharge to home today per Dr. Sherryll BurgerShah. Dr. Sherryll BurgerShah spoke with Ms. Cassandra Mccall's daughter concerning discharge plans. Would like to resume Home Health orders per [facility]. Feliberto GottronJason Hinton, [facility] representative updated. Palliative in the home. Dayna BarkerKaren Robertson, RN representative [facility] [place] updated Daughter will transport Cassandra GreetBrenda S Aquarius Latouche RN MSN CCM Care Management [phone]

## 2017-10-03 NOTE — Discharge Instructions (Addendum)

## 2017-10-03 NOTE — Progress Notes (Signed)
New referral for out patient Palliative to follow at home received from [facility] Brenda Holland. Plan is for discharge today. Patient information faxed to referral. °Karen Robertson RN, BSN, CHPN °[facility], hospital Liaison °[phone] °

## 2017-10-04 NOTE — Discharge Summary (Signed)
[facility] - [place] at [facility]   PATIENT NAME: Cassandra Mccall    MR#:  [id number]  DATE OF BIRTH:  Mar 11, 1929  DATE OF ADMISSION:  09/27/2017   ADMITTING PHYSICIAN: Arnaldo Natal, MD  DATE OF DISCHARGE: 10/03/2017 12:49 PM  PRIMARY CARE PHYSICIAN: Lauro Regulus, MD   ADMISSION DIAGNOSIS:  Community acquired pneumonia, unspecified laterality [J18.9] DISCHARGE DIAGNOSIS:  Active Problems:   Pneumonia  SECONDARY DIAGNOSIS:   Past Medical History:  Diagnosis Date  . Alzheimer disease 12/27/2015  . CHF (congestive heart failure) (HCC)   . Chronic bronchitis (HCC) 12/27/2015  . Chronic kidney disease   . Congestive heart failure, NYHA class 1, chronic, diastolic (HCC) 12/27/2015  . Controlled type 2 diabetes mellitus without complication (HCC) 12/27/2015  . COPD (chronic obstructive pulmonary disease) (HCC)   . Depression, major, in remission (HCC) 12/27/2015   on xanax prn  . Diabetes mellitus without complication (HCC)   . Factor V Leiden (HCC) 12/27/2015  . Hernia, hiatal 1989  . Hypertension   . Left-sided carotid artery disease (HCC) 12/27/2015   100% left sided, left so right  . PAD (peripheral artery disease) (HCC)   . PVD (peripheral vascular disease) (HCC) 12/27/2015   post stents in [facility]   HOSPITAL COURSE:   1acuteon chronichypoxic respiratory failure Resolved Secondary toacute on chronic congestive heart failure exacerbation>> CAP Did require ICU care with BiPAP during initial hospital stay-successfully weaned off,currently at baseline O2 requirement of 2 L continuous, in discussion with physical therapy-we will need SNF but the patient's daughter did not want her placed-wants to take patient home only in spite of mentioning she is very weak and may be difficult to manage at home.  2acute probable community-acquired pneumonia Resolving ContinueLevaquin for 5-day course  3acute on chronic diastolic  congestive heart failure exacerbation  Resolved Treated with IV Lasix initially, converted to p.o. Lasix on October 01, 2017, continue CHF protocol   4CKD stage III Stable Avoidnephrotoxic agents  5chronic dementia  Stable Continue increasednursing care,aspiration/fall/skin care precautions  6 acute constipation Resolved with bowel regimen  7 acute nausea/emesis Most likely secondary to significant constipation Resolved   8 acute on chronic poor appetite Daughter would like to stop megace DISCHARGE CONDITIONS:  fair CONSULTS OBTAINED:   DRUG ALLERGIES:   Allergies  Allergen Reactions  . Statins     Other reaction(s): Muscle Pain Unclear if took  . Sulfa Antibiotics    DISCHARGE MEDICATIONS:   Allergies as of 10/03/2017      Reactions   Statins    Other reaction(s): Muscle Pain Unclear if took   Sulfa Antibiotics       Medication List    STOP taking these medications   atorvastatin 80 MG tablet Commonly known as:  LIPITOR   Cholecalciferol 2000 units Caps   Dextromethorphan-Guaifenesin 5-100 MG/5ML Syrp   FISH OIL PO   guaiFENesin 200 MG tablet   memantine 10 MG tablet Commonly known as:  NAMENDA   potassium chloride 10 MEQ tablet Commonly known as:  K-DUR     TAKE these medications   acetaminophen 325 MG tablet Commonly known as:  TYLENOL Take 650 mg by mouth every 4 (four) hours as needed.   ADVAIR HFA 230-21 MCG/ACT inhaler Generic drug:  fluticasone-salmeterol Inhale 2 puffs into the lungs every 12 (twelve) hours as needed.   albuterol 108 (90 Base) MCG/ACT inhaler Commonly known as:  PROVENTIL HFA;VENTOLIN HFA Inhale 2 puffs into the lungs every  6 (six) hours as needed for wheezing or shortness of breath.   ALPRAZolam 0.25 MG tablet Commonly known as:  XANAX Take 0.125-0.25 mg by mouth 3 (three) times daily as needed for anxiety or sleep. 1/2 tab   amLODipine 2.5 MG tablet Commonly known as:  NORVASC Take 1 tablet  by mouth daily.   aspirin 81 MG EC tablet Take 1 tablet (81 mg total) by mouth daily.   clopidogrel 75 MG tablet Commonly known as:  PLAVIX Take 1 tablet by mouth daily.   DERMACLOUD Crea Apply liberal amount topically to area of skin irritation as needed. Ok to leave at bedside   feeding supplement (ENSURE ENLIVE) Liqd Take 237 mLs by mouth 2 (two) times daily between meals.   furosemide 20 MG tablet Commonly known as:  LASIX Take 1 tablet (20 mg total) by mouth daily as needed. What changed:    how much to take  when to take this  reasons to take this   ipratropium-albuterol 0.5-2.5 (3) MG/3ML Soln Commonly known as:  DUONEB Take 3 mLs by nebulization 3 (three) times daily.   metoprolol succinate 25 MG 24 hr tablet Commonly known as:  TOPROL-XL Take 1 tablet by mouth daily.   OXYGEN Inhale 2 L into the lungs continuous.   ranitidine 75 MG tablet Commonly known as:  ZANTAC Take 150 mg by mouth daily.   sodium chloride 0.65 % Soln nasal spray Commonly known as:  OCEAN Place 2 sprays into both nostrils as needed for congestion.   tiotropium 18 MCG inhalation capsule Commonly known as:  SPIRIVA Place 18 mcg into inhaler and inhale daily.   XOPENEX 1.25 MG/3ML nebulizer solution Generic drug:  levalbuterol Take 1.25 mg by nebulization every 6 (six) hours as needed for wheezing or shortness of breath.        DISCHARGE INSTRUCTIONS:   DIET:  Cardiac diet DISCHARGE CONDITION:  Fair ACTIVITY:  Activity as tolerated OXYGEN:  Home Oxygen: Yes.    Oxygen Delivery: 2 liters via [place] DISCHARGE LOCATION:  Initially daughter refused to take her to SNF - and took her home with home health and PC to follow but withing less than 24 hrs realized it's too much and now requesting placement. CSW and [facility] were nice enough to set up this placement without getting readmitted.  If you experience worsening of your admission symptoms, develop shortness of breath,  life threatening emergency, suicidal or homicidal thoughts you must seek medical attention immediately by calling 911 or calling your MD immediately  if symptoms less severe.  You Must read complete instructions/literature along with all the possible adverse reactions/side effects for all the Medicines you take and that have been prescribed to you. Take any new Medicines after you have completely understood and accpet all the possible adverse reactions/side effects.   Please note  You were cared for by a hospitalist during your hospital stay. If you have any questions about your discharge medications or the care you received while you were in the hospital after you are discharged, you can call the unit and asked to speak with the hospitalist on call if the hospitalist that took care of you is not available. Once you are discharged, your primary care physician will handle any further medical issues. Please note that NO REFILLS for any discharge medications will be authorized once you are discharged, as it is imperative that you return to your primary care physician (or establish a relationship with a primary care physician if  you do not have one) for your aftercare needs so that they can reassess your need for medications and monitor your lab values.    On the day of Discharge:  VITAL SIGNS:  Blood pressure 140/75, pulse 79, temperature 98.2 F (36.8 C), temperature source Oral, resp. rate (!) 24, height 5\' 3"  (1.6 m), weight 62.8 kg (138 lb 7.2 oz), SpO2 99 %. PHYSICAL EXAMINATION:  GENERAL:  84 y.o.-year-old patient lying in the bed with no acute distress.  EYES: Pupils equal, round, reactive to light and accommodation. No scleral icterus. Extraocular muscles intact.  HEENT: Head atraumatic, normocephalic. Oropharynx and nasopharynx clear.  NECK:  Supple, no jugular venous distention. No thyroid enlargement, no tenderness.  LUNGS: Normal breath sounds bilaterally, no wheezing, rales,rhonchi or  crepitation. No use of accessory muscles of respiration.  CARDIOVASCULAR: S1, S2 normal. No murmurs, rubs, or gallops.  ABDOMEN: Soft, non-tender, non-distended. Bowel sounds present. No organomegaly or mass.  EXTREMITIES: No pedal edema, cyanosis, or clubbing.  NEUROLOGIC: Cranial nerves II through XII are intact. Muscle strength 5/5 in all extremities. Sensation intact. Gait not checked.  PSYCHIATRIC: The patient is alert and oriented x 3.  SKIN: No obvious rash, lesion, or ulcer.  DATA REVIEW:   CBC No results for input(s): WBC, HGB, HCT, PLT in the last 168 hours.  Chemistries  Recent Labs  Lab 10/03/17 0520  NA 141  K 4.8  CL 103  CO2 26  GLUCOSE 238*  BUN 79*  CREATININE 2.94*  CALCIUM 8.8*  MG 2.9*     Microbiology Results  Results for orders placed or performed during the hospital encounter of 09/27/17  MRSA PCR Screening     Status: None   Collection Time: 09/27/17 10:10 AM  Result Value Ref Range Status   MRSA by PCR NEGATIVE NEGATIVE Final    Comment:        The GeneXpert MRSA Assay (FDA approved for NASAL specimens only), is one component of a comprehensive MRSA colonization surveillance program. It is not intended to diagnose MRSA infection nor to guide or monitor treatment for MRSA infections. Performed at [facility], [address], [place], [place] [address]      Contact information for follow-up providers    Lauro Regulus, MD. Go on 10/10/2017.   Specialty:  Internal Medicine Why:  @3 :15 PM Contact information: [address] [facility] [place] [address] [phone]            Contact information for after-discharge care    Destination    [facility] .   Service:  Skilled Nursing Contact information: [address] [place] [address] [phone]                  She is at very high risk for readmissions - highly recommend palliative care to follow  while at the facility and transition to Hospice if qualifies. Avoid Readmission. I also had extensive discussion with daughter to cut back unnecessary meds and she is in agreement.   Management plans discussed with the patient, family (daughter) and they are in agreement.  CODE STATUS: Prior   TOTAL TIME TAKING CARE OF THIS PATIENT: 45 minutes.    Delfino Lovett M.D on 10/04/2017 at 10:21 AM  Between 7am to 6pm - Pager - [phone]  After 6pm go to [email or web address] - Scientist, research (life sciences) [facility]  Office  [phone]  CC: Primary care physician; Einar Crow  W, MD   Note: This dictation was prepared with Dragon dictation along with smaller phrase technology. Any transcriptional errors that result from this process are unintentional.

## 2017-10-04 NOTE — [place] FL2 (Signed)
[place] MEDICAID FL2 LEVEL OF CARE SCREENING TOOL     IDENTIFICATION  Patient Name: Arvilla Salada Birthdate: 1929-02-26 Sex: female Admission Date (Current Location): 09/27/2017  [place] and [place] Number:  Chiropodist and Address:  [facility], [address] [place], [place] [address]      Provider Number: [id number]  Attending Physician Name and Address:  No att. providers found  Relative Name and Phone Number:       Current Level of Care: Hospital Recommended Level of Care: Skilled Nursing Facility Prior Approval Number:    Date Approved/Denied:   PASRR Number: [id number])  Discharge Plan: SNF    Current Diagnoses: Patient Active Problem List   Diagnosis Date Noted  . Pneumonia 09/27/2017  . Elevated troponin I level   . Respiratory distress   . Alzheimer disease   . Chronic kidney disease (CKD), stage IV (severe) (HCC)   . Palliative care encounter   . Acute respiratory failure with hypoxia (HCC) 08/15/2017    Orientation RESPIRATION BLADDER Height & Weight     Self, Place  O2(2.5 Liters ) Continent Weight: 138 lb 7.2 oz (62.8 kg) Height:  5\' 3"  (160 cm)  BEHAVIORAL SYMPTOMS/MOOD NEUROLOGICAL BOWEL NUTRITION STATUS      Continent Diet(Diet: Soft. )  AMBULATORY STATUS COMMUNICATION OF NEEDS Skin   Extensive Assist Verbally Normal                       Personal Care Assistance Level of Assistance  Bathing, Feeding, Dressing Bathing Assistance: Limited assistance Feeding assistance: Independent Dressing Assistance: Limited assistance     Functional Limitations Info  Sight, Hearing, Speech Sight Info: Impaired Hearing Info: Impaired Speech Info: Adequate    SPECIAL CARE FACTORS FREQUENCY  PT (By licensed PT), OT (By licensed OT)     PT Frequency: (5) OT Frequency: (5)            Contractures      Additional Factors Info  Code Status, Allergies Code Status Info: (DNR ) Allergies Info:  (Statins, Sulfa Antibiotics)           Current Medications (10/04/2017):  This is the current hospital active medication list No current facility-administered medications for this encounter.    Current Outpatient Medications  Medication Sig Dispense Refill  . acetaminophen (TYLENOL) 325 MG tablet Take 650 mg by mouth every 4 (four) hours as needed.    Marland Kitchen albuterol (PROVENTIL HFA;VENTOLIN HFA) 108 (90 Base) MCG/ACT inhaler Inhale 2 puffs into the lungs every 6 (six) hours as needed for wheezing or shortness of breath.    . ALPRAZolam (XANAX) 0.25 MG tablet Take 0.125-0.25 mg by mouth 3 (three) times daily as needed for anxiety or sleep. 1/2 tab     . amLODipine (NORVASC) 2.5 MG tablet Take 1 tablet by mouth daily.     Marland Kitchen aspirin EC 81 MG EC tablet Take 1 tablet (81 mg total) by mouth daily. 30 tablet 0  . clopidogrel (PLAVIX) 75 MG tablet Take 1 tablet by mouth daily.    . fluticasone-salmeterol (ADVAIR HFA) 230-21 MCG/ACT inhaler Inhale 2 puffs into the lungs every 12 (twelve) hours as needed.    Marland Kitchen ipratropium-albuterol (DUONEB) 0.5-2.5 (3) MG/3ML SOLN Take 3 mLs by nebulization 3 (three) times daily.    Marland Kitchen levalbuterol (XOPENEX) 1.25 MG/3ML nebulizer solution Take 1.25 mg by nebulization every 6 (six) hours as needed for wheezing or shortness of breath.    . metoprolol succinate (  TOPROL-XL) 25 MG 24 hr tablet Take 1 tablet by mouth daily.    . ranitidine (ZANTAC) 75 MG tablet Take 150 mg by mouth daily.     . sodium chloride (OCEAN) 0.65 % SOLN nasal spray Place 2 sprays into both nostrils as needed for congestion.    Marland Kitchen. tiotropium (SPIRIVA) 18 MCG inhalation capsule Place 18 mcg into inhaler and inhale daily.    . feeding supplement, ENSURE ENLIVE, (ENSURE ENLIVE) LIQD Take 237 mLs by mouth 2 (two) times daily between meals. 237 mL 12  . furosemide (LASIX) 20 MG tablet Take 1 tablet (20 mg total) by mouth daily as needed. 30 tablet 0  . Infant Care Products (DERMACLOUD) CREA Apply liberal  amount topically to area of skin irritation as needed. Ok to leave at bedside    . OXYGEN Inhale 2 L into the lungs continuous.       Discharge Medications: Please see discharge summary for a list of discharge medications.  Relevant Imaging Results:  Relevant Lab Results:   Additional Information (SSN: [id number])  Pasha Broad, Darleen CrockerBailey M, LCSW

## 2017-10-04 NOTE — Progress Notes (Signed)
Clinical Social Worker (CSW) received a call from patient's daughter Clydie BraunKaren today 10/04/17 stating that she can't take care of patient at home and wants her to be placed from home. CSW presented bed offers and made her aware that patient can be placed from home because she has had a 3 night inpatient stay 09/27/17-10/03/17. Daughter chose [facility]. Per Lovett CalenderJoseph [facility] liaison they can take patient today from home. MD aware of above and completed D/C summary, signed FL2 and provided xanax prescription. Daughter is aware that co-pays for [facility] will start on day 1 and will be $170.50 per day. CSW arranged EMS transport from home. Please reconsult if future social work needs arise. CSW signing off.   [facility] Hughes IncorporatedBailey Dori Devino, LCSW [phone]

## 2017-10-19 ENCOUNTER — Other Ambulatory Visit: Payer: Self-pay

## 2017-10-19 ENCOUNTER — Emergency Department: Payer: Medicare Other

## 2017-10-19 ENCOUNTER — Inpatient Hospital Stay
Admission: EM | Admit: 2017-10-19 | Discharge: 2017-10-22 | DRG: 280 | Disposition: A | Discharge status: Hospice | Payer: Medicare Other | Attending: Internal Medicine | Admitting: Internal Medicine

## 2017-10-19 ENCOUNTER — Encounter: Payer: Self-pay | Admitting: Emergency Medicine

## 2017-10-19 DIAGNOSIS — I5023 Acute on chronic systolic (congestive) heart failure: Secondary | ICD-10-CM | POA: Diagnosis present

## 2017-10-19 DIAGNOSIS — E1122 Type 2 diabetes mellitus with diabetic chronic kidney disease: Secondary | ICD-10-CM | POA: Diagnosis present

## 2017-10-19 DIAGNOSIS — G309 Alzheimer's disease, unspecified: Secondary | ICD-10-CM | POA: Diagnosis present

## 2017-10-19 DIAGNOSIS — I4510 Unspecified right bundle-branch block: Secondary | ICD-10-CM | POA: Diagnosis present

## 2017-10-19 DIAGNOSIS — I130 Hypertensive heart and chronic kidney disease with heart failure and stage 1 through stage 4 chronic kidney disease, or unspecified chronic kidney disease: Secondary | ICD-10-CM | POA: Diagnosis present

## 2017-10-19 DIAGNOSIS — Z8249 Family history of ischemic heart disease and other diseases of the circulatory system: Secondary | ICD-10-CM | POA: Diagnosis not present

## 2017-10-19 DIAGNOSIS — F325 Major depressive disorder, single episode, in full remission: Secondary | ICD-10-CM | POA: Diagnosis present

## 2017-10-19 DIAGNOSIS — Z79899 Other long term (current) drug therapy: Secondary | ICD-10-CM

## 2017-10-19 DIAGNOSIS — N184 Chronic kidney disease, stage 4 (severe): Secondary | ICD-10-CM | POA: Diagnosis present

## 2017-10-19 DIAGNOSIS — Z825 Family history of asthma and other chronic lower respiratory diseases: Secondary | ICD-10-CM | POA: Diagnosis not present

## 2017-10-19 DIAGNOSIS — Z820 Family history of epilepsy and other diseases of the nervous system: Secondary | ICD-10-CM

## 2017-10-19 DIAGNOSIS — Z9981 Dependence on supplemental oxygen: Secondary | ICD-10-CM | POA: Diagnosis not present

## 2017-10-19 DIAGNOSIS — Z66 Do not resuscitate: Secondary | ICD-10-CM | POA: Diagnosis present

## 2017-10-19 DIAGNOSIS — D500 Iron deficiency anemia secondary to blood loss (chronic): Secondary | ICD-10-CM | POA: Diagnosis present

## 2017-10-19 DIAGNOSIS — Z7902 Long term (current) use of antithrombotics/antiplatelets: Secondary | ICD-10-CM | POA: Diagnosis not present

## 2017-10-19 DIAGNOSIS — J9621 Acute and chronic respiratory failure with hypoxia: Secondary | ICD-10-CM | POA: Diagnosis present

## 2017-10-19 DIAGNOSIS — I959 Hypotension, unspecified: Secondary | ICD-10-CM | POA: Diagnosis not present

## 2017-10-19 DIAGNOSIS — D6851 Activated protein C resistance: Secondary | ICD-10-CM | POA: Diagnosis present

## 2017-10-19 DIAGNOSIS — R079 Chest pain, unspecified: Secondary | ICD-10-CM

## 2017-10-19 DIAGNOSIS — R000 Tachycardia, unspecified: Secondary | ICD-10-CM | POA: Diagnosis not present

## 2017-10-19 DIAGNOSIS — Z881 Allergy status to other antibiotic agents status: Secondary | ICD-10-CM

## 2017-10-19 DIAGNOSIS — J449 Chronic obstructive pulmonary disease, unspecified: Secondary | ICD-10-CM | POA: Diagnosis present

## 2017-10-19 DIAGNOSIS — E1151 Type 2 diabetes mellitus with diabetic peripheral angiopathy without gangrene: Secondary | ICD-10-CM | POA: Diagnosis present

## 2017-10-19 DIAGNOSIS — R0602 Shortness of breath: Secondary | ICD-10-CM | POA: Diagnosis present

## 2017-10-19 DIAGNOSIS — I509 Heart failure, unspecified: Secondary | ICD-10-CM

## 2017-10-19 DIAGNOSIS — F0280 Dementia in other diseases classified elsewhere without behavioral disturbance: Secondary | ICD-10-CM | POA: Diagnosis present

## 2017-10-19 DIAGNOSIS — Z87891 Personal history of nicotine dependence: Secondary | ICD-10-CM

## 2017-10-19 DIAGNOSIS — I214 Non-ST elevation (NSTEMI) myocardial infarction: Secondary | ICD-10-CM | POA: Diagnosis present

## 2017-10-19 DIAGNOSIS — Z888 Allergy status to other drugs, medicaments and biological substances status: Secondary | ICD-10-CM

## 2017-10-19 LAB — CREATININE, SERUM
Creatinine, Ser: 2.58 mg/dL — ABNORMAL HIGH (ref 0.44–1.00)
GFR calc non Af Amer: 16 mL/min — ABNORMAL LOW (ref >60)
GFR, EST AFRICAN AMERICAN: 18 mL/min — AB (ref >60)

## 2017-10-19 LAB — URINALYSIS, COMPLETE (UACMP) WITH MICROSCOPIC
Bilirubin Urine: NEGATIVE
Glucose, UA: 150 mg/dL — AB
Hgb urine dipstick: NEGATIVE
KETONES UR: NEGATIVE mg/dL
Leukocytes, UA: NEGATIVE
Nitrite: NEGATIVE
PH: 5 (ref 5.0–8.0)
Protein, ur: 100 mg/dL — AB
SPECIFIC GRAVITY, URINE: 1.016 (ref 1.005–1.030)

## 2017-10-19 LAB — CBC WITH DIFFERENTIAL/PLATELET
Basophils Absolute: 0 10*3/uL (ref 0–0.1)
Basophils Relative: 0 %
EOS ABS: 0 10*3/uL (ref 0–0.7)
EOS PCT: 0 %
HCT: 30.9 % — ABNORMAL LOW (ref 35.0–47.0)
Hemoglobin: 10 g/dL — ABNORMAL LOW (ref 12.0–16.0)
LYMPHS ABS: 0.6 10*3/uL — AB (ref 1.0–3.6)
Lymphocytes Relative: 7 %
MCH: 29.9 pg (ref 26.0–34.0)
MCHC: 32.5 g/dL (ref 32.0–36.0)
MCV: 92 fL (ref 80.0–100.0)
MONOS PCT: 9 %
Monocytes Absolute: 0.7 10*3/uL (ref 0.2–0.9)
Neutro Abs: 6.8 10*3/uL — ABNORMAL HIGH (ref 1.4–6.5)
Neutrophils Relative %: 84 %
PLATELETS: 265 10*3/uL (ref 150–440)
RBC: 3.36 MIL/uL — ABNORMAL LOW (ref 3.80–5.20)
RDW: 14.5 % (ref 11.5–14.5)
WBC: 8.1 10*3/uL (ref 3.6–11.0)

## 2017-10-19 LAB — CBC
HCT: 31.4 % — ABNORMAL LOW (ref 35.0–47.0)
Hemoglobin: 10.1 g/dL — ABNORMAL LOW (ref 12.0–16.0)
MCH: 29.6 pg (ref 26.0–34.0)
MCHC: 32.2 g/dL (ref 32.0–36.0)
MCV: 91.9 fL (ref 80.0–100.0)
Platelets: 260 K/uL (ref 150–440)
RBC: 3.42 MIL/uL — ABNORMAL LOW (ref 3.80–5.20)
RDW: 14.8 % — ABNORMAL HIGH (ref 11.5–14.5)
WBC: 9 K/uL (ref 3.6–11.0)

## 2017-10-19 LAB — COMPREHENSIVE METABOLIC PANEL
ALT: 13 U/L — AB (ref 14–54)
ANION GAP: 10 (ref 5–15)
AST: 21 U/L (ref 15–41)
Albumin: 3.5 g/dL (ref 3.5–5.0)
Alkaline Phosphatase: 107 U/L (ref 38–126)
BUN: 49 mg/dL — ABNORMAL HIGH (ref 6–20)
CHLORIDE: 108 mmol/L (ref 101–111)
CO2: 19 mmol/L — AB (ref 22–32)
CREATININE: 2.34 mg/dL — AB (ref 0.44–1.00)
Calcium: 8.7 mg/dL — ABNORMAL LOW (ref 8.9–10.3)
GFR calc Af Amer: 21 mL/min — ABNORMAL LOW (ref >60)
GFR calc non Af Amer: 18 mL/min — ABNORMAL LOW (ref >60)
Glucose, Bld: 258 mg/dL — ABNORMAL HIGH (ref 65–99)
POTASSIUM: 5.2 mmol/L — AB (ref 3.5–5.1)
SODIUM: 137 mmol/L (ref 135–145)
Total Bilirubin: 0.7 mg/dL (ref 0.3–1.2)
Total Protein: 7.6 g/dL (ref 6.5–8.1)

## 2017-10-19 LAB — GLUCOSE, CAPILLARY
Glucose-Capillary: 250 mg/dL — ABNORMAL HIGH (ref 65–99)
Glucose-Capillary: 175 mg/dL — ABNORMAL HIGH (ref 65–99)
Glucose-Capillary: 139 mg/dL — ABNORMAL HIGH (ref 65–99)

## 2017-10-19 LAB — BRAIN NATRIURETIC PEPTIDE: B NATRIURETIC PEPTIDE 5: 1199 pg/mL — AB (ref 0.0–100.0)

## 2017-10-19 LAB — LACTIC ACID, PLASMA: LACTIC ACID, VENOUS: 1.3 mmol/L (ref 0.5–1.9)

## 2017-10-19 LAB — TROPONIN I
TROPONIN I: 4.79 ng/mL — AB (ref <0.03)
Troponin I: 0.05 ng/mL (ref <0.03)
Troponin I: 8.44 ng/mL
Troponin I: 8.89 ng/mL

## 2017-10-19 LAB — INFLUENZA PANEL BY PCR (TYPE A & B)
INFLAPCR: NEGATIVE
INFLBPCR: NEGATIVE

## 2017-10-19 LAB — APTT: aPTT: 117 seconds — ABNORMAL HIGH (ref 24–36)

## 2017-10-19 LAB — HEPARIN LEVEL (UNFRACTIONATED): HEPARIN UNFRACTIONATED: 0.59 [IU]/mL (ref 0.30–0.70)

## 2017-10-19 LAB — PROTIME-INR
INR: 1.07
PROTHROMBIN TIME: 13.8 s (ref 11.4–15.2)

## 2017-10-19 MED ORDER — AMLODIPINE BESYLATE 5 MG PO TABS: 2.5000 mg | ORAL_TABLET | Freq: Every day | ORAL | Status: DC

## 2017-10-19 MED ORDER — PREDNISONE 20 MG PO TABS
40.0000 mg | ORAL_TABLET | Freq: Every day | ORAL | Status: DC
  Administered 2017-10-19: 40 mg via ORAL
  Filled 2017-10-19: qty 2

## 2017-10-19 MED ORDER — FUROSEMIDE 10 MG/ML IJ SOLN
40.0000 mg | Freq: Two times a day (BID) | INTRAMUSCULAR | Status: DC
  Administered 2017-10-19 – 2017-10-22 (×7): 40 mg via INTRAVENOUS
  Filled 2017-10-19 (×7): qty 4

## 2017-10-19 MED ORDER — SALINE SPRAY 0.65 % NA SOLN: 2.0000 | NASAL | Status: DC | PRN

## 2017-10-19 MED ORDER — MOMETASONE FURO-FORMOTEROL FUM 200-5 MCG/ACT IN AERO
2.0000 | INHALATION_SPRAY | Freq: Two times a day (BID) | RESPIRATORY_TRACT | Status: DC
  Administered 2017-10-19 – 2017-10-22 (×7): 2 via RESPIRATORY_TRACT
  Filled 2017-10-19: qty 8.8

## 2017-10-19 MED ORDER — INSULIN ASPART 100 UNIT/ML ~~LOC~~ SOLN
0.0000 [IU] | Freq: Three times a day (TID) | SUBCUTANEOUS | Status: DC
  Administered 2017-10-19: 4 [IU] via SUBCUTANEOUS
  Administered 2017-10-19: 7 [IU] via SUBCUTANEOUS
  Administered 2017-10-20 (×2): 4 [IU] via SUBCUTANEOUS
  Administered 2017-10-20 – 2017-10-21 (×2): 3 [IU] via SUBCUTANEOUS
  Administered 2017-10-21: 4 [IU] via SUBCUTANEOUS
  Administered 2017-10-22: 3 [IU] via SUBCUTANEOUS
  Filled 2017-10-19 (×9): qty 1

## 2017-10-19 MED ORDER — DOCUSATE SODIUM 100 MG PO CAPS
100.0000 mg | ORAL_CAPSULE | Freq: Two times a day (BID) | ORAL | Status: DC
  Administered 2017-10-19 – 2017-10-22 (×7): 100 mg via ORAL
  Filled 2017-10-19 (×7): qty 1

## 2017-10-19 MED ORDER — VITAMIN D 1000 UNITS PO TABS
ORAL_TABLET | ORAL | Status: AC
  Administered 2017-10-19: 2000 [IU] via ORAL
  Filled 2017-10-19: qty 2

## 2017-10-19 MED ORDER — HYDROCORTISONE 1 % EX CREA
TOPICAL_CREAM | Freq: Two times a day (BID) | CUTANEOUS | Status: DC
  Administered 2017-10-19 – 2017-10-22 (×6): via TOPICAL
  Filled 2017-10-19: qty 28

## 2017-10-19 MED ORDER — HEPARIN BOLUS VIA INFUSION
3800.0000 [IU] | Freq: Once | INTRAVENOUS | Status: AC
  Administered 2017-10-19: 3800 [IU] via INTRAVENOUS
  Filled 2017-10-19: qty 3800

## 2017-10-19 MED ORDER — ENSURE ENLIVE PO LIQD
237.0000 mL | Freq: Two times a day (BID) | ORAL | Status: DC
  Administered 2017-10-19 – 2017-10-21 (×4): 237 mL via ORAL

## 2017-10-19 MED ORDER — MORPHINE SULFATE (PF) 2 MG/ML IV SOLN
2.0000 mg | Freq: Once | INTRAVENOUS | Status: AC
  Administered 2017-10-19: 2 mg via INTRAVENOUS
  Filled 2017-10-19: qty 1

## 2017-10-19 MED ORDER — ONDANSETRON HCL 4 MG PO TABS
4.0000 mg | ORAL_TABLET | Freq: Four times a day (QID) | ORAL | Status: DC | PRN
Start: 2017-10-19 — End: 2017-10-22

## 2017-10-19 MED ORDER — VITAMIN D 1000 UNITS PO TABS
2000.0000 [IU] | ORAL_TABLET | Freq: Every day | ORAL | Status: DC
  Administered 2017-10-19 – 2017-10-22 (×4): 2000 [IU] via ORAL
  Filled 2017-10-19 (×4): qty 2

## 2017-10-19 MED ORDER — ACETAMINOPHEN 650 MG RE SUPP: 650.0000 mg | Freq: Four times a day (QID) | RECTAL | Status: DC | PRN

## 2017-10-19 MED ORDER — IPRATROPIUM-ALBUTEROL 0.5-2.5 (3) MG/3ML IN SOLN
RESPIRATORY_TRACT | Status: AC
  Filled 2017-10-19: qty 3

## 2017-10-19 MED ORDER — METOPROLOL SUCCINATE ER 50 MG PO TB24: 25.0000 mg | ORAL_TABLET | Freq: Every day | ORAL | Status: DC

## 2017-10-19 MED ORDER — SODIUM CHLORIDE 0.9% FLUSH
3.0000 mL | Freq: Two times a day (BID) | INTRAVENOUS | Status: DC
  Administered 2017-10-19 – 2017-10-22 (×7): 3 mL via INTRAVENOUS

## 2017-10-19 MED ORDER — ATORVASTATIN CALCIUM 80 MG PO TABS
80.0000 mg | ORAL_TABLET | Freq: Every day | ORAL | Status: DC
  Administered 2017-10-19 – 2017-10-21 (×3): 80 mg via ORAL
  Filled 2017-10-19 (×2): qty 4
  Filled 2017-10-19: qty 1
  Filled 2017-10-19: qty 4
  Filled 2017-10-19 (×3): qty 1

## 2017-10-19 MED ORDER — FUROSEMIDE 10 MG/ML IJ SOLN
20.0000 mg | Freq: Once | INTRAMUSCULAR | Status: AC
  Administered 2017-10-19: 20 mg via INTRAVENOUS
  Filled 2017-10-19: qty 4

## 2017-10-19 MED ORDER — FAMOTIDINE 20 MG PO TABS
20.0000 mg | ORAL_TABLET | Freq: Every day | ORAL | Status: DC
  Administered 2017-10-19 – 2017-10-22 (×4): 20 mg via ORAL
  Filled 2017-10-19 (×4): qty 1

## 2017-10-19 MED ORDER — ASPIRIN 81 MG PO CHEW
324.0000 mg | CHEWABLE_TABLET | Freq: Once | ORAL | Status: AC
  Administered 2017-10-19: 324 mg via ORAL
  Filled 2017-10-19: qty 4

## 2017-10-19 MED ORDER — ISOSORBIDE MONONITRATE ER 60 MG PO TB24
60.0000 mg | ORAL_TABLET | Freq: Every day | ORAL | Status: DC
  Administered 2017-10-19 – 2017-10-22 (×4): 60 mg via ORAL
  Filled 2017-10-19 (×4): qty 1

## 2017-10-19 MED ORDER — OMEGA-3-ACID ETHYL ESTERS 1 G PO CAPS
1.0000 g | ORAL_CAPSULE | Freq: Two times a day (BID) | ORAL | Status: DC
  Administered 2017-10-19 – 2017-10-22 (×7): 1 g via ORAL
  Filled 2017-10-19 (×7): qty 1

## 2017-10-19 MED ORDER — INSULIN ASPART 100 UNIT/ML ~~LOC~~ SOLN: 0.0000 [IU] | Freq: Every day | SUBCUTANEOUS | Status: DC

## 2017-10-19 MED ORDER — FUROSEMIDE 10 MG/ML IJ SOLN
20.0000 mg | Freq: Two times a day (BID) | INTRAMUSCULAR | Status: DC
  Administered 2017-10-19: 20 mg via INTRAVENOUS
  Filled 2017-10-19: qty 4

## 2017-10-19 MED ORDER — CLOPIDOGREL BISULFATE 75 MG PO TABS
75.0000 mg | ORAL_TABLET | Freq: Every day | ORAL | Status: DC
  Administered 2017-10-19 – 2017-10-22 (×4): 75 mg via ORAL
  Filled 2017-10-19 (×4): qty 1

## 2017-10-19 MED ORDER — HEPARIN SODIUM (PORCINE) 5000 UNIT/ML IJ SOLN: 5000.0000 [IU] | Freq: Three times a day (TID) | INTRAMUSCULAR | Status: DC

## 2017-10-19 MED ORDER — ASPIRIN EC 81 MG PO TBEC
DELAYED_RELEASE_TABLET | ORAL | Status: AC
  Administered 2017-10-19: 81 mg via ORAL
  Filled 2017-10-19: qty 1

## 2017-10-19 MED ORDER — CLOPIDOGREL BISULFATE 75 MG PO TABS
ORAL_TABLET | ORAL | Status: AC
  Administered 2017-10-19: 75 mg via ORAL
  Filled 2017-10-19: qty 1

## 2017-10-19 MED ORDER — ONDANSETRON HCL 4 MG/2ML IJ SOLN
4.0000 mg | Freq: Once | INTRAMUSCULAR | Status: AC
  Administered 2017-10-19: 4 mg via INTRAVENOUS
  Filled 2017-10-19: qty 2

## 2017-10-19 MED ORDER — ACETAMINOPHEN 325 MG PO TABS: 650.0000 mg | ORAL_TABLET | Freq: Four times a day (QID) | ORAL | Status: DC | PRN

## 2017-10-19 MED ORDER — PREDNISONE 20 MG PO TABS
ORAL_TABLET | ORAL | Status: AC
  Administered 2017-10-19: 13:00:00
  Filled 2017-10-19: qty 2

## 2017-10-19 MED ORDER — ALBUTEROL SULFATE HFA 108 (90 BASE) MCG/ACT IN AERS: 2.0000 | INHALATION_SPRAY | Freq: Four times a day (QID) | RESPIRATORY_TRACT | Status: DC | PRN

## 2017-10-19 MED ORDER — FAMOTIDINE 20 MG PO TABS
ORAL_TABLET | ORAL | Status: AC
  Administered 2017-10-19: 20 mg via ORAL
  Filled 2017-10-19: qty 1

## 2017-10-19 MED ORDER — SODIUM CHLORIDE 0.9 % IV SOLN: 250.0000 mL | INTRAVENOUS | Status: DC | PRN

## 2017-10-19 MED ORDER — HEPARIN (PORCINE) IN NACL 100-0.45 UNIT/ML-% IJ SOLN
750.0000 [IU]/h | INTRAMUSCULAR | Status: DC
  Administered 2017-10-19 – 2017-10-20 (×2): 750 [IU]/h via INTRAVENOUS
  Filled 2017-10-19 (×2): qty 250

## 2017-10-19 MED ORDER — ALBUTEROL SULFATE (2.5 MG/3ML) 0.083% IN NEBU: 2.5000 mg | INHALATION_SOLUTION | Freq: Four times a day (QID) | RESPIRATORY_TRACT | Status: DC | PRN

## 2017-10-19 MED ORDER — SODIUM CHLORIDE 0.9% FLUSH: 3.0000 mL | INTRAVENOUS | Status: DC | PRN

## 2017-10-19 MED ORDER — ALPRAZOLAM 0.25 MG PO TABS
0.1250 mg | ORAL_TABLET | Freq: Three times a day (TID) | ORAL | Status: DC | PRN
  Administered 2017-10-21 (×2): 0.25 mg via ORAL
  Filled 2017-10-19 (×2): qty 1

## 2017-10-19 MED ORDER — ASPIRIN EC 81 MG PO TBEC
81.0000 mg | DELAYED_RELEASE_TABLET | Freq: Every day | ORAL | Status: DC
  Administered 2017-10-19 – 2017-10-22 (×4): 81 mg via ORAL
  Filled 2017-10-19 (×4): qty 1

## 2017-10-19 MED ORDER — DOCUSATE SODIUM 100 MG PO CAPS
ORAL_CAPSULE | ORAL | Status: AC
  Administered 2017-10-19: 100 mg via ORAL
  Filled 2017-10-19: qty 1

## 2017-10-19 MED ORDER — IPRATROPIUM-ALBUTEROL 0.5-2.5 (3) MG/3ML IN SOLN
3.0000 mL | Freq: Four times a day (QID) | RESPIRATORY_TRACT | Status: DC
  Administered 2017-10-19 – 2017-10-21 (×9): 3 mL via RESPIRATORY_TRACT
  Filled 2017-10-19 (×8): qty 3

## 2017-10-19 MED ORDER — ONDANSETRON HCL 4 MG/2ML IJ SOLN: 4.0000 mg | Freq: Four times a day (QID) | INTRAMUSCULAR | Status: DC | PRN

## 2017-10-19 MED ORDER — ADULT MULTIVITAMIN W/MINERALS CH
1.0000 | ORAL_TABLET | Freq: Every day | ORAL | Status: DC
  Administered 2017-10-19 – 2017-10-22 (×4): 1 via ORAL
  Filled 2017-10-19 (×4): qty 1

## 2017-10-19 NOTE — ED Notes (Signed)
Family at bedside. 

## 2017-10-19 NOTE — ED Notes (Signed)
Huntley DecSara, RN attempted to call report. RN unable to take report and said they would call us back.

## 2017-10-19 NOTE — Progress Notes (Signed)
Initial Nutrition Assessment  DOCUMENTATION CODES:   Not applicable  INTERVENTION:   Ensure Enlive po BID, each supplement provides 350 kcal and 20 grams of protein  MVI daily  NUTRITION DIAGNOSIS:   Inadequate oral intake related to acute illness as evidenced by per patient/family report.  GOAL:   Patient will meet greater than or equal to 90% of their needs  MONITOR:   PO intake, Supplement acceptance, Labs, Weight trends, I & O's, Skin  REASON FOR ASSESSMENT:   Consult Assessment of nutrition requirement/status  ASSESSMENT:   84 y/o female with h/o COPD, CHF, dementia, DM, CKD IV, recent PNA, admitted for SOB   RD familiar with this pt from previous admits. Pt is a poor historian but reports poor appetite and oral intake for several weeks pta. Pt did not eat well on last admit a few weeks ago. Pt reports that she does drink vanilla Ensure but not regularly. Per chart, pt is weight stable; however, weight from this admit appears to be reported. RD will add Ensure to help pt meet her estimated needs. Recommend obtain measured weight.   Medications reviewed and include: aspirin, vit D, plavix, colace, pepcid, lasix, insulin, lovaza, prednisone  Labs reviewed: K 5.2(H), BUN 49(H), creat 2.58(H), Ca 8.7(L) P 3.6 wnl, Mg 2.9(H)- 2/13 BNP- 1199(H) Vitamin D, 25 hydroxy 26.0(L)- 1/8 B12- 1275(H)- 1/8 Hgb 10.1(L), Hct 31.4(L) BG 258  Nutrition-Focused physical exam completed. Findings are no fat depletion, no muscle depletion, and mild edema. Pt noted to have bruising on bilateral arms; reports recent fall.   Diet Order:  Diet 2 gram sodium Room service appropriate? Yes; Fluid consistency: Thin; Fluid restriction: 1500 mL Fluid  EDUCATION NEEDS:   Education needs have been addressed  Skin:  Reviewed RN Assessment  Last BM:  pta  Height:   Ht Readings from Last 1 Encounters:  10/19/17 5\' 3"  (1.6 m)    Weight:   Wt Readings from Last 1 Encounters:  10/19/17  138 lb (62.6 kg)    Ideal Body Weight:  52.3 kg  BMI:  Body mass index is 24.45 kg/m.  Estimated Nutritional Needs:   Kcal:  1400-1600kcal/day   Protein:  69-83g/day   Fluid:  per MD  Betsey Holidayasey Marni Franzoni MS, RD, LDN Pager #[phone] After Hours Pager: [phone]

## 2017-10-19 NOTE — ED Notes (Signed)
Patient being transported to the floor by Huntley DecSara, RN for bedside report

## 2017-10-19 NOTE — ED Triage Notes (Signed)
Patient has COPD,  Tachy, CHF, known blockage iin the left leg and left aorta, lives with daughter, on 3L of O2

## 2017-10-19 NOTE — Progress Notes (Signed)
Inpatient Diabetes Program Recommendations  AACE/ADA: New Consensus Statement on Inpatient Glycemic Control (2015)  Target Ranges:  Prepandial:   less than 140 mg/dL      [facility] postprandial:   less than 180 mg/dL (1-2 hours)      Critically ill patients:  140 - 180 mg/dL   Lab Results  Component Value Date   GLUCAP 250 (H) 10/19/2017    Review of Glycemic Control  Results for [name], Cassandra (MRN [id number]) as of 10/19/2017 14:32  Ref. Range 10/19/2017 11:53  Glucose-Capillary Latest Ref Range: 65 - 99 mg/dL 409250 (H)    Diabetes history: Type 2 Outpatient Diabetes medications: none Current orders for Inpatient glycemic control: Novolog 0-20 units tid, Novolog 0-5 units qhs  Inpatient Diabetes Program Recommendations:   Results for [name], Cassandra Mccall (MRN [id number]) as of 10/19/2017 14:32  Ref. Range 10/19/2017 10:52  GFR, Est Non African American Latest Ref Range: >60 mL/min 16 (L)   Results for [name], Cassandra Mccall (MRN [id number]) as of 10/19/2017 14:32  Ref. Range 10/19/2017 01:15  Creatinine Latest Ref Range: 0.44 - 1.00 mg/dL 5.782.34 (H)  Results for [name], Cassandra Mccall (MRN [id number]) as of 10/19/2017 14:32  Ref. Range 10/19/2017 01:15  BUN Latest Ref Range: 6 - 20 mg/dL 49 (H)  Poor renal function- consider decreasing Novolog 0-20 units tid to  0-9 units tid -  Could add very low dose Lantus insulin to assist with CBG control while patient is on steroids, Lantus 6 units qhs (0.1unit/kg)   If you add the Lantus, I would d/c the hs Novolog 0-5 units  Text page to Dr. Elpidio AnisSudini at 2:39pm  Susette RacerJulie Cheryn Lundquist, RN, BA, MHA, CDE Diabetes Coordinator Inpatient Diabetes Program  [phone] (Team Pager) [phone] [facility] Office) 10/19/2017 2:37 PM

## 2017-10-19 NOTE — Progress Notes (Signed)
PT Cancellation Note  Patient Details Name: Cassandra Mccall MRN: [id number] DOB: 05-09-1929   Cancelled Treatment:     RN contacted following chart review.  Per RN, pt is still experiencing severe SOB and increased RR.  Pt has been administered Lasix and RN would like for PT to allow the pt some more time prior to PT evaluation.  PT will re-attempt 10/20/2017 or later today if time allows.   Glenetta HewSarah Elizbeth Posa, PT, DPT 10/19/2017, 12:52 PM

## 2017-10-19 NOTE — H&P (Signed)
[employer] [facility] [place] [facility]   PATIENT NAME: Cassandra Mccall    MR#:  [id number]  DATE OF BIRTH:  1928-11-15  DATE OF ADMISSION:  10/19/2017  PRIMARY CARE PHYSICIAN: Lauro RegulusAnderson, Marshall W, MD   REQUESTING/REFERRING PHYSICIAN:   CHIEF COMPLAINT:   Chief Complaint  Patient presents with  . Shortness of Breath    HISTORY OF PRESENT ILLNESS: Cassandra DanielMargie Blocher  is a 84 y.o. female with a known history per below, presents to the emergency department with shortness of breath per patient's daughter, in the emergency room patient was noted to have congestive heart failure on chest x-ray, elevated BNP greater than 1000, potassium 5.2, troponin 0.05, urinalysis negative, patient evaluated at the bedside emergency room, no apparent distress, resting comfortably in bed on her chronic O2 of 3 L via nasal cannula-currently satting 100%, patient is now being admitted for acute recurrent diastolic congestive heart failure exacerbation.  PAST MEDICAL HISTORY:   Past Medical History:  Diagnosis Date  . Alzheimer disease 12/27/2015  . CHF (congestive heart failure) (HCC)   . Chronic bronchitis (HCC) 12/27/2015  . Chronic kidney disease   . Congestive heart failure, NYHA class 1, chronic, diastolic (HCC) 12/27/2015  . Controlled type 2 diabetes mellitus without complication (HCC) 12/27/2015  . COPD (chronic obstructive pulmonary disease) (HCC)   . Depression, major, in remission (HCC) 12/27/2015   on xanax prn  . Diabetes mellitus without complication (HCC)   . Factor V Leiden (HCC) 12/27/2015  . Hernia, hiatal 1989  . Hypertension   . Left-sided carotid artery disease (HCC) 12/27/2015   100% left sided, left so right  . PAD (peripheral artery disease) (HCC)   . PVD (peripheral vascular disease) (HCC) 12/27/2015   post stents in [place]    PAST SURGICAL HISTORY:  Past Surgical History:  Procedure Laterality Date  . BREAST EXCISIONAL BIOPSY    . TONSILLECTOMY    .  VASCULAR SURGERY      SOCIAL HISTORY:  Social History   Tobacco Use  . Smoking status: Former Smoker    Types: Cigarettes  . Smokeless tobacco: Never Used  Substance Use Topics  . Alcohol use: No    Frequency: Never    FAMILY HISTORY:  Family History  Problem Relation Age of Onset  . COPD Mother   . Heart failure Mother   . Alzheimer's disease Father   . COPD Sister     DRUG ALLERGIES:  Allergies  Allergen Reactions  . Statins     Other reaction(s): Muscle Pain Unclear if took  . Sulfa Antibiotics     REVIEW OF SYSTEMS: Patient is poor historian due to dementia  CONSTITUTIONAL: No fever, fatigue or weakness.  EYES: No blurred or double vision.  EARS, NOSE, AND THROAT: No tinnitus or ear pain.  RESPIRATORY: No cough,+ shortness of breath,no wheezing or hemoptysis.  CARDIOVASCULAR: No chest pain, orthopnea, edema.  GASTROINTESTINAL: No nausea, vomiting, diarrhea or abdominal pain.  GENITOURINARY: No dysuria, hematuria.  ENDOCRINE: No polyuria, nocturia,  HEMATOLOGY: No anemia, easy bruising or bleeding SKIN: No rash or lesion. MUSCULOSKELETAL: No joint pain or arthritis.   NEUROLOGIC: No tingling, numbness, weakness.  PSYCHIATRY: No anxiety or depression.   MEDICATIONS AT HOME:  Prior to Admission medications   Medication Sig Start Date End Date Taking? Authorizing Provider  acetaminophen (TYLENOL) 325 MG tablet Take 650 mg by mouth every 4 (four) hours as needed.   Yes [provider]  albuterol (PROVENTIL HFA;VENTOLIN HFA) 108 (  90 Base) MCG/ACT inhaler Inhale 2 puffs into the lungs every 6 (six) hours as needed for wheezing or shortness of breath.   Yes [provider]  ALPRAZolam (XANAX) 0.25 MG tablet Take 0.125-0.25 mg by mouth 3 (three) times daily as needed for anxiety or sleep. 1/2 tab    Yes [provider]  amLODipine (NORVASC) 2.5 MG tablet Take 1 tablet by mouth daily.  07/05/17  Yes [provider]  aspirin EC 81  MG EC tablet Take 1 tablet (81 mg total) by mouth daily. 08/22/17  Yes Delfino Lovett, MD  atorvastatin (LIPITOR) 80 MG tablet Take 1 tablet by mouth at bedtime.   Yes [provider]  clopidogrel (PLAVIX) 75 MG tablet Take 1 tablet by mouth daily.   Yes [provider]  desonide (DESOWEN) 0.05 % ointment Apply 1 application topically 2 (two) times daily.   Yes [provider]  fluticasone-salmeterol (ADVAIR HFA) 230-21 MCG/ACT inhaler Inhale 2 puffs into the lungs 2 (two) times daily.  08/08/17 08/08/18 Yes [provider]  furosemide (LASIX) 20 MG tablet Take 1 tablet (20 mg total) by mouth daily as needed. Patient taking differently: Take 20 mg by mouth daily.  10/03/17  Yes Delfino Lovett, MD  ipratropium-albuterol (DUONEB) 0.5-2.5 (3) MG/3ML SOLN Take 3 mLs by nebulization every 8 (eight) hours as needed.    Yes [provider]  metoprolol succinate (TOPROL-XL) 25 MG 24 hr tablet Take 1 tablet by mouth daily. 07/05/17  Yes [provider]  predniSONE (DELTASONE) 20 MG tablet 3 TABLETS DAILY X 5 DAYS, THEN 2 TABLETS DAILY X 5 DAYS, THEN 1 TABLET DAILY X 5 DAYS THEN STOP. 10/17/17  Yes [provider]  ranitidine (ZANTAC) 75 MG tablet Take 150 mg by mouth daily.    Yes [provider]  sodium chloride (OCEAN) 0.65 % SOLN nasal spray Place 2 sprays into both nostrils as needed for congestion.   Yes [provider]  SPIRIVA RESPIMAT 2.5 MCG/ACT AERS INHALE 1 PUFF INTO THE LUNGS ONCE DAILY. 09/24/17  Yes [provider]  cholecalciferol (VITAMIN D) 1000 units tablet Take 2,000 Units by mouth daily.    [provider]  feeding supplement, ENSURE ENLIVE, (ENSURE ENLIVE) LIQD Take 237 mLs by mouth 2 (two) times daily between meals. 10/03/17   Delfino Lovett, MD  Infant [employer] [facility]) CREA Apply liberal amount topically to area of skin irritation as needed. Ok to leave at bedside    [provider]   levalbuterol Pauline Aus) 1.25 MG/3ML nebulizer solution Take 1.25 mg by nebulization every 6 (six) hours as needed for wheezing or shortness of breath.    [provider]  omega-3 acid ethyl esters (LOVAZA) 1 g capsule Take 1 g by mouth 2 (two) times daily.    [provider]  OXYGEN Inhale 2 L into the lungs continuous.    [provider]  tiotropium (SPIRIVA) 18 MCG inhalation capsule Place 18 mcg into inhaler and inhale daily.    [provider]      PHYSICAL EXAMINATION:   VITAL SIGNS: Blood pressure 128/66, pulse 89, temperature 98.2 F (36.8 C), temperature source Oral, resp. rate (!) 29, height 5\' 3"  (1.6 m), weight 62.6 kg (138 lb), SpO2 100 %.  GENERAL:  84 y.o.-year-old patient lying in the bed with no acute distress.  Frail-appearing EYES: Pupils equal, round, reactive to light and accommodation. No scleral icterus. Extraocular muscles intact.  HEENT: Head atraumatic, normocephalic. Oropharynx and  nasopharynx clear.  NECK:  Supple, no jugular venous distention. No thyroid enlargement, no tenderness.  LUNGS: Minimal Rales on auscultation of the lungs bilaterally. No use of accessory muscles of respiration.  CARDIOVASCULAR: S1, S2 normal. No murmurs, rubs, or gallops.  ABDOMEN: Soft, nontender, nondistended. Bowel sounds present. No organomegaly or mass.  EXTREMITIES: Mild bilateral lower extremity edema, cyanosis, or clubbing.  NEUROLOGIC: Cranial nerves II through XII are intact. MAES. Gait not checked.  PSYCHIATRIC: The patient is alert and oriented x 3.  SKIN: No obvious rash, lesion, or ulcer.   LABORATORY PANEL:   CBC Recent Labs  Lab 10/19/17 0116  WBC 8.1  HGB 10.0*  HCT 30.9*  PLT 265  MCV 92.0  MCH 29.9  MCHC 32.5  RDW 14.5  LYMPHSABS 0.6*  MONOABS 0.7  EOSABS 0.0  BASOSABS 0.0   ------------------------------------------------------------------------------------------------------------------  Chemistries  Recent  Labs  Lab 10/19/17 0115  NA 137  K 5.2*  CL 108  CO2 19*  GLUCOSE 258*  BUN 49*  CREATININE 2.34*  CALCIUM 8.7*  AST 21  ALT 13*  ALKPHOS 107  BILITOT 0.7   ------------------------------------------------------------------------------------------------------------------ estimated creatinine clearance is 14.3 mL/min (A) (by C-G formula based on SCr of 2.34 mg/dL (H)). ------------------------------------------------------------------------------------------------------------------ No results for input(s): TSH, T4TOTAL, T3FREE, THYROIDAB in the last 72 hours.  Invalid input(s): FREET3   Coagulation profile No results for input(s): INR, PROTIME in the last 168 hours. ------------------------------------------------------------------------------------------------------------------- No results for input(s): DDIMER in the last 72 hours. -------------------------------------------------------------------------------------------------------------------  Cardiac Enzymes Recent Labs  Lab 10/19/17 0115  TROPONINI 0.05*   ------------------------------------------------------------------------------------------------------------------ Invalid input(s): POCBNP  ---------------------------------------------------------------------------------------------------------------  Urinalysis    Component Value Date/Time   COLORURINE YELLOW (A) 10/19/2017 0147   APPEARANCEUR CLOUDY (A) 10/19/2017 0147   LABSPEC 1.016 10/19/2017 0147   PHURINE 5.0 10/19/2017 0147   GLUCOSEU 150 (A) 10/19/2017 0147   HGBUR NEGATIVE 10/19/2017 0147   BILIRUBINUR NEGATIVE 10/19/2017 0147   KETONESUR NEGATIVE 10/19/2017 0147   PROTEINUR 100 (A) 10/19/2017 0147   NITRITE NEGATIVE 10/19/2017 0147   LEUKOCYTESUR NEGATIVE 10/19/2017 0147     RADIOLOGY: Dg Chest Port 1 View  Result Date: 10/19/2017 CLINICAL DATA:  COPD, tachycardia, CHF EXAM: PORTABLE CHEST 1 VIEW COMPARISON:  09/28/2017 FINDINGS:  Cardiomegaly with small bilateral pleural effusions, vascular congestion and bilateral perihilar and lower lobe opacities. Findings most likely reflect edema/CHF. Findings are similar prior study. IMPRESSION: CHF.  Small bilateral effusions. Electronically Signed   By: Charlett Nose M.D.   On: 10/19/2017 02:13    EKG: Orders placed or performed during the hospital encounter of 10/19/17  . EKG 12-Lead  . EKG 12-Lead    IMPRESSION AND PLAN: 1acuteon chronichypoxic respiratory failure Resolved Secondary toacute on chronic congestive heart failure exacerbation Referred to the observation unit, IV Lasix twice daily, currently at baseline O2 requirement of 2 L Noted numerous hospital admissions for similar presentation  2acute on chronic diastolic congestive heart failure exacerbation  Admit to regular nursing floor bed on our congestive heart failure protocol, IV Lasix twice daily, strict I&O monitoring, daily weights, aspirin, Plavix, Lipitor, Toprol-XL  3CKD stage III Stable Avoidnephrotoxic agents  4chronic dementia  Stable Nursing care as needed, aspiration/fall/skin care precautions  All the records are reviewed and case discussed with ED provider. Management plans discussed with the patient, family and they are in agreement.  CODE STATUS:dnr Code Status History    Date Active Date Inactive Code Status Order ID Comments User Context   09/27/2017 04:44 10/03/2017 15:55  DNR [id number]  Arnaldo Natal, MD Inpatient   08/15/2017 10:55 08/21/2017 18:55 DNR [id number]  Shaune Pollack, MD Inpatient    Questions for Most Recent Historical Code Status (Order [id number])    Question Answer Comment   In the event of cardiac or respiratory ARREST Do not call a "code blue"    In the event of cardiac or respiratory ARREST Do not perform Intubation, CPR, defibrillation or ACLS    In the event of cardiac or respiratory ARREST Use medication by any route, position, wound care, and other  measures to relive pain and suffering. May use oxygen, suction and manual treatment of airway obstruction as needed for comfort.         Advance Directive Documentation     Most Recent Value  Type of Advance Directive  Out of facility DNR (pink MOST or yellow form), Healthcare Power of Attorney  Pre-existing out of facility DNR order (yellow form or pink MOST form)  No data  "MOST" Form in Place?  No data       TOTAL TIME TAKING CARE OF THIS PATIENT: 45 minutes.    Evelena Asa Adessa Primiano M.D on 10/19/2017   Between 7am to 6pm - Pager - [phone]  After 6pm go to [email or web address] - Social research officer, government  [facility]  Office  [phone]  CC: Primary care physician; Lauro Regulus, MD   Note: This dictation was prepared with Dragon dictation along with smaller phrase technology. Any transcriptional errors that result from this process are unintentional.

## 2017-10-19 NOTE — Progress Notes (Incomplete)
Initial Nutrition Assessment  DOCUMENTATION CODES:   Not applicable  INTERVENTION:   .ENSURE   NUTRITION DIAGNOSIS:   Inadequate oral intake related to acute illness as evidenced by per patient/family report.  ***  GOAL:   Patient will meet greater than or equal to 90% of their needs  ***  MONITOR:   PO intake, Supplement acceptance, Labs, Weight trends, I & O's, Skin  REASON FOR ASSESSMENT:   Consult Assessment of nutrition requirement/status  ASSESSMENT:   84 y/o female with h/o COPD, CHF, dementia, DM, CKD IV, recent PNA, admitted for SOB  ***   NUTRITION - FOCUSED PHYSICAL EXAM:  {RD Focused Exam List:21252}  Diet Order:  Diet 2 gram sodium Room service appropriate? Yes; Fluid consistency: Thin; Fluid restriction: 1500 mL Fluid  EDUCATION NEEDS:   Education needs have been addressed  Skin:  Skin Assessment: Reviewed RN Assessment  Last BM:  pta  Height:   Ht Readings from Last 1 Encounters:  10/19/17 5\' 3"  (1.6 m)    Weight:   Wt Readings from Last 1 Encounters:  10/19/17 138 lb (62.6 kg)    Ideal Body Weight:  52.3 kg  BMI:  Body mass index is 24.45 kg/m.  Estimated Nutritional Needs:   Kcal:  1400-1600kcal/day   Protein:  69-83g/day   Fluid:  per MD    ***

## 2017-10-19 NOTE — Consult Note (Signed)
[facility] Cardiology  CARDIOLOGY CONSULT NOTE  Patient ID: Cassandra Mccall MRN: [id number] DOB/AGE: 1929-03-05 84 y.o.  Admit date: 10/19/2017 Referring Physician Sudini Primary Physician [facility] Primary Cardiologist  Reason for Consultation non-ST elevation myocardial infarction  HPI: 84 year old female referred for evaluation of non-ST elevation myocardial infarction.  Patient brought to [facility] emergency room with chief complaint of shortness of breath.  Chest x-ray consistent with congestive heart failure.  Patient has a history of COPD and chronic diastolic congestive heart failure chronically on 3 L of O2 by nasal cannula.  EKG revealed sinus rhythm with right bundle branch block.  Admission labs were notable for troponin of 4.79.  BUN and creatinine were 71 and 2.58, respectively.  The patient has Alzheimer's dementia and is DNR.  Review of systems complete and found to be negative unless listed above     Past Medical History:  Diagnosis Date  . Alzheimer disease 12/27/2015  . CHF (congestive heart failure) (HCC)   . Chronic bronchitis (HCC) 12/27/2015  . Chronic kidney disease   . Congestive heart failure, NYHA class 1, chronic, diastolic (HCC) 12/27/2015  . Controlled type 2 diabetes mellitus without complication (HCC) 12/27/2015  . COPD (chronic obstructive pulmonary disease) (HCC)   . Depression, major, in remission (HCC) 12/27/2015   on xanax prn  . Diabetes mellitus without complication (HCC)   . Factor V Leiden (HCC) 12/27/2015  . Hernia, hiatal 1989  . Hypertension   . Left-sided carotid artery disease (HCC) 12/27/2015   100% left sided, left so right  . PAD (peripheral artery disease) (HCC)   . PVD (peripheral vascular disease) (HCC) 12/27/2015   post stents in [place]    Past Surgical History:  Procedure Laterality Date  . BREAST EXCISIONAL BIOPSY    . TONSILLECTOMY    . VASCULAR SURGERY      Medications Prior to Admission  Medication Sig Dispense Refill Last  Dose  . acetaminophen (TYLENOL) 325 MG tablet Take 650 mg by mouth every 4 (four) hours as needed.   prn at prn  . albuterol (PROVENTIL HFA;VENTOLIN HFA) 108 (90 Base) MCG/ACT inhaler Inhale 2 puffs into the lungs every 6 (six) hours as needed for wheezing or shortness of breath.   prn at prn  . ALPRAZolam (XANAX) 0.25 MG tablet Take 0.125-0.25 mg by mouth 3 (three) times daily as needed for anxiety or sleep. 1/2 tab    prn at prn  . amLODipine (NORVASC) 2.5 MG tablet Take 1 tablet by mouth daily.    10/18/2017 at Unknown time  . aspirin EC 81 MG EC tablet Take 1 tablet (81 mg total) by mouth daily. 30 tablet 0 10/18/2017 at Unknown time  . atorvastatin (LIPITOR) 80 MG tablet Take 1 tablet by mouth at bedtime.   Unknown at Unknown  . clopidogrel (PLAVIX) 75 MG tablet Take 1 tablet by mouth daily.   10/18/2017 at Unknown time  . desonide (DESOWEN) 0.05 % ointment Apply 1 application topically 2 (two) times daily.   10/18/2017 at Unknown time  . fluticasone-salmeterol (ADVAIR HFA) 230-21 MCG/ACT inhaler Inhale 2 puffs into the lungs 2 (two) times daily.    10/18/2017 at Unknown time  . furosemide (LASIX) 20 MG tablet Take 1 tablet (20 mg total) by mouth daily as needed. (Patient taking differently: Take 20 mg by mouth daily. ) 30 tablet 0 10/18/2017 at Unknown time  . ipratropium-albuterol (DUONEB) 0.5-2.5 (3) MG/3ML SOLN Take 3 mLs by nebulization every 8 (eight) hours as needed.  10/18/2017 at Unknown time  . metoprolol succinate (TOPROL-XL) 25 MG 24 hr tablet Take 1 tablet by mouth daily.   10/18/2017 at Unknown time  . predniSONE (DELTASONE) 20 MG tablet 3 TABLETS DAILY X 5 DAYS, THEN 2 TABLETS DAILY X 5 DAYS, THEN 1 TABLET DAILY X 5 DAYS THEN STOP.  0 10/18/2017 at Unknown time  . ranitidine (ZANTAC) 75 MG tablet Take 150 mg by mouth daily.    10/18/2017 at Unknown time  . sodium chloride (OCEAN) 0.65 % SOLN nasal spray Place 2 sprays into both nostrils as needed for congestion.   prn at prn  . SPIRIVA  RESPIMAT 2.5 MCG/ACT AERS INHALE 1 PUFF INTO THE LUNGS ONCE DAILY.  3 10/18/2017 at Unknown time  . cholecalciferol (VITAMIN D) 1000 units tablet Take 2,000 Units by mouth daily.     . feeding supplement, ENSURE ENLIVE, (ENSURE ENLIVE) LIQD Take 237 mLs by mouth 2 (two) times daily between meals. 237 mL 12   . Infant Care Products (DERMACLOUD) CREA Apply liberal amount topically to area of skin irritation as needed. Ok to leave at bedside   Not Taking at Unknown time  . levalbuterol (XOPENEX) 1.25 MG/3ML nebulizer solution Take 1.25 mg by nebulization every 6 (six) hours as needed for wheezing or shortness of breath.   Not Taking at Unknown time  . omega-3 acid ethyl esters (LOVAZA) 1 g capsule Take 1 g by mouth 2 (two) times daily.     . OXYGEN Inhale 2 L into the lungs continuous.   Taking  . tiotropium (SPIRIVA) 18 MCG inhalation capsule Place 18 mcg into inhaler and inhale daily.   Not Taking at Unknown time   Social History   Socioeconomic History  . Marital status: Widowed    Spouse name: Not on file  . Number of children: 2  . Years of education: 6712  . Highest education level: High school graduate  Social Needs  . Financial resource strain: Not on file  . Food insecurity - worry: Not on file  . Food insecurity - inability: Not on file  . Transportation needs - medical: Not on file  . Transportation needs - non-medical: Not on file  Occupational History  . Not on file  Tobacco Use  . Smoking status: Former Smoker    Types: Cigarettes  . Smokeless tobacco: Never Used  Substance and Sexual Activity  . Alcohol use: No    Frequency: Never  . Drug use: No  . Sexual activity: No  Other Topics Concern  . Not on file  Social History Narrative   DNR with [facility]   Widowed   2 children   Former smoker   Alcohol - none   Smokeless tobacco - none    Family History  Problem Relation Age of Onset  . COPD Mother   . Heart failure Mother   . Alzheimer's disease Father   . COPD  Sister       Review of systems complete and found to be negative unless listed above      PHYSICAL EXAM  General: Well developed, well nourished, in no acute distress HEENT:  Normocephalic and atramatic Neck:  No JVD.  Lungs: Clear bilaterally to auscultation and percussion. Heart: HRRR . Normal S1 and S2 without gallops or murmurs.  Abdomen: Bowel sounds are positive, abdomen soft and non-tender  Msk:  Back normal, normal gait. Normal strength and tone for age. Extremities: No clubbing, cyanosis or edema.   Neuro: Alert and  oriented X 3. Psych:  Good affect, responds appropriately  Labs:   Lab Results  Component Value Date   WBC 9.0 10/19/2017   HGB 10.1 (L) 10/19/2017   HCT 31.4 (L) 10/19/2017   MCV 91.9 10/19/2017   PLT 260 10/19/2017    Recent Labs  Lab 10/19/17 0115 10/19/17 1052  NA 137  --   K 5.2*  --   CL 108  --   CO2 19*  --   BUN 49*  --   CREATININE 2.34* 2.58*  CALCIUM 8.7*  --   PROT 7.6  --   BILITOT 0.7  --   ALKPHOS 107  --   ALT 13*  --   AST 21  --   GLUCOSE 258*  --    Lab Results  Component Value Date   TROPONINI 4.79 (HH) 10/19/2017   No results found for: CHOL No results found for: HDL No results found for: LDLCALC No results found for: TRIG No results found for: CHOLHDL No results found for: LDLDIRECT    Radiology: Dg Chest 2 View  Result Date: 09/27/2017 CLINICAL DATA:  Breathing difficulty EXAM: CHEST  2 VIEW COMPARISON:  08/16/2017 FINDINGS: Small bilateral pleural effusions. Cardiomegaly with vascular congestion and diffuse interstitial edema. Possible superimposed pneumonia at the bases. Aortic atherosclerosis. No pneumothorax. IMPRESSION: 1. Similar appearance of small bilateral effusions and bibasilar atelectasis or pneumonia 2. Continued cardiomegaly with vascular congestion and diffuse interstitial prominence compatible with mild edema. Electronically Signed   By: Jasmine Pang M.D.   On: 09/27/2017 01:39   Dg Pelvis 1-2  Views  Result Date: 09/27/2017 CLINICAL DATA:  Larey Seat yesterday with sacrococcygeal pain EXAM: PELVIS - 1-2 VIEW COMPARISON:  None. FINDINGS: The bones are diffusely osteopenic. Both hip joint spaces appear well preserved for age. The pelvic rami are intact. The SI joints show degenerative change but no acute sacral fracture is evident by plain film. There are degenerative changes in the lower lumbar spine. IMPRESSION: 1. No acute fracture. 2. Osteopenia. 3. Degenerative change in the lower lumbar spine and SI joints. Electronically Signed   By: Dwyane Dee M.D.   On: 09/27/2017 11:14   Dg Sacrum/coccyx  Result Date: 09/27/2017 CLINICAL DATA:  Larey Seat yesterday with pain in the sacrococcygeal region EXAM: SACRUM AND COCCYX - 2+ VIEW COMPARISON:  None. FINDINGS: The bones are diffusely osteopenic. No acute sacral or coccygeal fracture is seen. The sacral foramina appear corticated. Degenerative changes are noted in the SI joints. IMPRESSION: No acute fracture.  Osteopenia. Electronically Signed   By: Dwyane Dee M.D.   On: 09/27/2017 11:14   Dg Chest Port 1 View  Result Date: 10/19/2017 CLINICAL DATA:  COPD, tachycardia, CHF EXAM: PORTABLE CHEST 1 VIEW COMPARISON:  09/28/2017 FINDINGS: Cardiomegaly with small bilateral pleural effusions, vascular congestion and bilateral perihilar and lower lobe opacities. Findings most likely reflect edema/CHF. Findings are similar prior study. IMPRESSION: CHF.  Small bilateral effusions. Electronically Signed   By: Charlett Nose M.D.   On: 10/19/2017 02:13   Dg Chest Port 1 View  Result Date: 09/28/2017 CLINICAL DATA:  Encounter for respiratory failure. Hx HTN, DM, COPD, CHF, bronchitis. Former smoker. EXAM: PORTABLE CHEST 1 VIEW COMPARISON:  09/27/2017 FINDINGS: Cardiac silhouette is mildly enlarged. There are bilateral pleural effusions. Bilateral interstitial thickening is noted greater on the right. There are prominent bronchovascular markings bilaterally. A small amount of  fluid is noted in the peripheral minor fissure. No pneumothorax. Skeletal structures are demineralized but grossly  intact. IMPRESSION: 1. Findings are similar to the previous day's study. Cardiomegaly with interstitial thickening, prominent bronchovascular markings and bilateral effusions, greater on the right. Suspect asymmetric pulmonary edema due to congestive heart failure. Pneumonia is possible. Electronically Signed   By: Amie Portland M.D.   On: 09/28/2017 11:45   Dg Abd 2 Views  Result Date: 10/03/2017 CLINICAL DATA:  Persistent vomiting. History of diabetes, CHF, chronic bronchitis. EXAM: ABDOMEN - 2 VIEW COMPARISON:  Abdominal series of October 01, 2017 FINDINGS: There are loops of normal caliber to mildly distended colon from the cecum to the distal descending colon. There is some gas within the rectosigmoid. There is no small bowel obstructive pattern. There is no free extraluminal gas. There are degenerative changes of the lumbar spine and SI joints. There are bilateral pleural effusions greatest on the right. IMPRESSION: Moderate gaseous distention of the colon in a fashion that does not appear obstructive. There is some gas in the rectosigmoid and rectum as well. No evidence of a small bowel obstruction nor other acute intra-abdominal abnormality. Electronically Signed   By: David  [place] M.D.   On: 10/03/2017 07:59   Dg Abd 2 Views  Result Date: 10/01/2017 CLINICAL DATA:  Vomiting, increasing shortness of breath. History of CHF, COPD, former smoker, coronary artery disease, and diabetes. EXAM: ABDOMEN - 2 VIEW COMPARISON:  AP pelvis of September 27, 2017 FINDINGS: The lung bases exhibit patchy interstitial densities with small bilateral pleural effusions. Within the abdomen the colonic stool burden is increased. There no small or large bowel obstructive pattern. There are degenerative changes of both SI joints. There is multilevel degenerative disc disease of the lumbar spine. There is  calcification within visceral arteries. IMPRESSION: Increase colonic stool burden may reflect constipation. No evidence of a fecal impaction or small bowel obstruction. Bibasilar atelectasis or pneumonia with small right pleural effusion and trace left pleural effusion. Intra-abdominal arterial atherosclerosis. Electronically Signed   By: David  [place] M.D.   On: 10/01/2017 08:16    EKG: Normal sinus rhythm with right bundle branch block  ASSESSMENT AND PLAN:   1.  Probable non-ST elevation myocardial infarction, nondiagnostic ECG, currently without chest pain, on heparin drip.  Patient has known Alzheimer's dementia, and underlying chronic kidney disease, very high risk for contrast-induced renal failure and potential dialysis. 2.  Respiratory failure, COPD, chronic diastolic congestive heart failure 3.  Stage III-IV chronic kidney disease with baseline BUN and creatinine of 71 and 2.58, respectively, GFR 18  Recommendations  1.  Recommend initial conservative management 2.  Defer cardiac catheterization 3.  Continue heparin drip 48-72 hours 4.  Continue diuresis 5.  Carefully monitor renal status 6.  Consider nephrology consult 7.  Further recommendations pending patient's clinical course  Signed: Marcina Millard MD,PhD, [facility] 10/19/2017, 1:10 PM

## 2017-10-19 NOTE — ED Notes (Addendum)
Date and time results received: 10/19/17 0829 (use smartphrase ".now" to insert current time)  Test: Troponin Critical Value: 4.79  Name of Provider Notified: Dr. Elpidio AnisSudini  Orders Received? Or Actions Taken?: No new orders.  Informed MD of low BP. Pt asymptomatic at this time. Will continue to monitor.

## 2017-10-19 NOTE — ED Notes (Signed)
Patient is resting comfortably. 

## 2017-10-19 NOTE — Progress Notes (Signed)
[facility] - [place] at [facility]   PATIENT NAME: Cassandra Mccall    MR#:  [id number]  DATE OF BIRTH:  1928/09/30  SUBJECTIVE:  CHIEF COMPLAINT:   Chief Complaint  Patient presents with  . Shortness of Breath   Still has SOB. No CP today  REVIEW OF SYSTEMS:    Review of Systems  Constitutional: Positive for malaise/fatigue. Negative for chills and fever.  HENT: Negative for sore throat.   Eyes: Negative for blurred vision, double vision and pain.  Respiratory: Positive for cough and shortness of breath. Negative for hemoptysis and wheezing.   Cardiovascular: Negative for chest pain, palpitations, orthopnea and leg swelling.  Gastrointestinal: Negative for abdominal pain, constipation, diarrhea, heartburn, nausea and vomiting.  Genitourinary: Negative for dysuria and hematuria.  Musculoskeletal: Negative for back pain and joint pain.  Skin: Negative for rash.  Neurological: Positive for weakness. Negative for sensory change, speech change, focal weakness and headaches.  Endo/Heme/Allergies: Does not bruise/bleed easily.  Psychiatric/Behavioral: Negative for depression. The patient is not nervous/anxious.     DRUG ALLERGIES:   Allergies  Allergen Reactions  . Statins     Other reaction(s): Muscle Pain Unclear if took  . Sulfa Antibiotics     VITALS:  Blood pressure (!) 147/91, pulse 100, temperature 98.2 F (36.8 C), temperature source Oral, resp. rate (!) 21, height 5\' 3"  (1.6 m), weight 62.6 kg (138 lb), SpO2 98 %.  PHYSICAL EXAMINATION:   Physical Exam  GENERAL:  84 y.o.-year-old patient lying in the bed with conversational dyspnea. Looks critically ill EYES: Pupils equal, round, reactive to light and accommodation. No scleral icterus. Extraocular muscles intact.  HEENT: Head atraumatic, normocephalic. Oropharynx and nasopharynx clear.  NECK:  Supple, no jugular venous distention. No thyroid enlargement, no tenderness.  LUNGS: Decreased breath  sounds. Increased work of breathing CARDIOVASCULAR: S1, S2 normal. No murmurs, rubs, or gallops.  ABDOMEN: Soft, nontender, nondistended. Bowel sounds present. No organomegaly or mass.  EXTREMITIES: No cyanosis, clubbing or edema b/l.    NEUROLOGIC: Cranial nerves II through XII are intact. No focal Motor or sensory deficits b/l.   PSYCHIATRIC: The patient is alert and oriented x 3.  SKIN: No obvious rash, lesion, or ulcer.   LABORATORY PANEL:   CBC Recent Labs  Lab 10/19/17 1052  WBC 9.0  HGB 10.1*  HCT 31.4*  PLT 260   ------------------------------------------------------------------------------------------------------------------ Chemistries  Recent Labs  Lab 10/19/17 0115 10/19/17 1052  NA 137  --   K 5.2*  --   CL 108  --   CO2 19*  --   GLUCOSE 258*  --   BUN 49*  --   CREATININE 2.34* 2.58*  CALCIUM 8.7*  --   AST 21  --   ALT 13*  --   ALKPHOS 107  --   BILITOT 0.7  --    ------------------------------------------------------------------------------------------------------------------  Cardiac Enzymes Recent Labs  Lab 10/19/17 1052  TROPONINI 8.44*   ------------------------------------------------------------------------------------------------------------------  RADIOLOGY:  Dg Chest Port 1 View  Result Date: 10/19/2017 CLINICAL DATA:  COPD, tachycardia, CHF EXAM: PORTABLE CHEST 1 VIEW COMPARISON:  09/28/2017 FINDINGS: Cardiomegaly with small bilateral pleural effusions, vascular congestion and bilateral perihilar and lower lobe opacities. Findings most likely reflect edema/CHF. Findings are similar prior study. IMPRESSION: CHF.  Small bilateral effusions. Electronically Signed   By: Charlett Nose M.D.   On: 10/19/2017 02:13     ASSESSMENT AND PLAN:   * NSTEMI Patient started on heparin ASA BB held due to  hypotension Cardiology consult No plans for cath. Medical management due to CKD Will check echo  * CKD 4 Monitor Cr while  diuresing Monitor I/o  * Acute on chronic systolic chf with acute hypoxic resp failure - IV Lasix - Input and Output - Counseled to limit fluids and Salt - Monitor Bun/Cr and Potassium - Echo -Cardiology follow up after discharge   All the records are reviewed and case discussed with Care Management/Social Worker Management plans discussed with the patient, family and they are in agreement.  CODE STATUS: DNR  DVT Prophylaxis: SCDs  TOTAL CRITICAL CARE TIME TAKING CARE OF THIS PATIENT: 35 minutes.   POSSIBLE D/C IN 2-3 DAYS, DEPENDING ON CLINICAL CONDITION.  Molinda BailiffSrikar R Tallie Hevia M.D on 10/19/2017 at 4:01 PM  Between 7am to 6pm - Pager - [phone]  After 6pm go to [email or web address] - password [email or web address] [facility]  [facility]  Office  [phone]  CC: Primary care physician; Lauro RegulusAnderson, Marshall W, MD  Note: This dictation was prepared with Dragon dictation along with smaller phrase technology. Any transcriptional errors that result from this process are unintentional.

## 2017-10-19 NOTE — ED Notes (Signed)
Pt to the ER for SOB. Pt has a hx of COPD, CHF, PNE. Pt was just d/c from [facility] recently. Pt seems to have labored breathing but is anxious. Pt has dementia and is confused. Resp rate is increased.

## 2017-10-19 NOTE — ED Provider Notes (Signed)
[facility] Emergency Department Provider Note   ____________________________________________   First MD Initiated Contact with Patient 10/19/17 0140     (approximate)  I have reviewed the triage vital signs and the nursing notes.   HISTORY  Chief Complaint Shortness of Breath    HPI Cassandra Mccall is a 84 y.o. female brought to the ED from home via EMS with a chief complaint of chest pain and shortness of breath.  Patient has a history of Alzheimer's dementia, congestive heart failure on continuous oxygen.  Daughter reports patient was seen by her PCP on Monday for bronchitis and prescribed Augmentin and prednisone.  Daughter reports increased shortness of breath over the evening.  Daughter gave 3 nebulizer treatments without much effect.  Denies fever, chills, abdominal pain, nausea, vomiting.  Denies recent travel or trauma.   Past Medical History:  Diagnosis Date  . Alzheimer disease 12/27/2015  . CHF (congestive heart failure) (HCC)   . Chronic bronchitis (HCC) 12/27/2015  . Chronic kidney disease   . Congestive heart failure, NYHA class 1, chronic, diastolic (HCC) 12/27/2015  . Controlled type 2 diabetes mellitus without complication (HCC) 12/27/2015  . COPD (chronic obstructive pulmonary disease) (HCC)   . Depression, major, in remission (HCC) 12/27/2015   on xanax prn  . Diabetes mellitus without complication (HCC)   . Factor V Leiden (HCC) 12/27/2015  . Hernia, hiatal 1989  . Hypertension   . Left-sided carotid artery disease (HCC) 12/27/2015   100% left sided, left so right  . PAD (peripheral artery disease) (HCC)   . PVD (peripheral vascular disease) (HCC) 12/27/2015   post stents in [place]    Patient Active Problem List   Diagnosis Date Noted  . Pneumonia 09/27/2017  . Elevated troponin I level   . Respiratory distress   . Alzheimer disease   . Chronic kidney disease (CKD), stage IV (severe) (HCC)   . Palliative care  encounter   . Acute respiratory failure with hypoxia (HCC) 08/15/2017    Past Surgical History:  Procedure Laterality Date  . BREAST EXCISIONAL BIOPSY    . TONSILLECTOMY    . VASCULAR SURGERY      Prior to Admission medications   Medication Sig Start Date End Date Taking? Authorizing Provider  acetaminophen (TYLENOL) 325 MG tablet Take 650 mg by mouth every 4 (four) hours as needed.   Yes [provider]  albuterol (PROVENTIL HFA;VENTOLIN HFA) 108 (90 Base) MCG/ACT inhaler Inhale 2 puffs into the lungs every 6 (six) hours as needed for wheezing or shortness of breath.   Yes [provider]  ALPRAZolam (XANAX) 0.25 MG tablet Take 0.125-0.25 mg by mouth 3 (three) times daily as needed for anxiety or sleep. 1/2 tab    Yes [provider]  amLODipine (NORVASC) 2.5 MG tablet Take 1 tablet by mouth daily.  07/05/17  Yes [provider]  aspirin EC 81 MG EC tablet Take 1 tablet (81 mg total) by mouth daily. 08/22/17  Yes Delfino Lovett, MD  clopidogrel (PLAVIX) 75 MG tablet Take 1 tablet by mouth daily.   Yes [provider]  desonide (DESOWEN) 0.05 % ointment Apply 1 application topically 2 (two) times daily.   Yes [provider]  fluticasone-salmeterol (ADVAIR HFA) 230-21 MCG/ACT inhaler Inhale 2 puffs into the lungs 2 (two) times daily.  08/08/17 08/08/18 Yes [provider]  furosemide (LASIX) 20 MG tablet Take 1 tablet (20 mg total) by mouth daily as needed. Patient taking differently:  Take 20 mg by mouth daily.  10/03/17  Yes Delfino Lovett, MD  ipratropium-albuterol (DUONEB) 0.5-2.5 (3) MG/3ML SOLN Take 3 mLs by nebulization every 8 (eight) hours as needed.    Yes [provider]  metoprolol succinate (TOPROL-XL) 25 MG 24 hr tablet Take 1 tablet by mouth daily. 07/05/17  Yes [provider]  predniSONE (DELTASONE) 20 MG tablet 3 TABLETS DAILY X 5 DAYS, THEN 2 TABLETS DAILY X 5 DAYS, THEN 1 TABLET DAILY X 5 DAYS THEN  STOP. 10/17/17  Yes [provider]  ranitidine (ZANTAC) 75 MG tablet Take 150 mg by mouth daily.    Yes [provider]  sodium chloride (OCEAN) 0.65 % SOLN nasal spray Place 2 sprays into both nostrils as needed for congestion.   Yes [provider]  SPIRIVA RESPIMAT 2.5 MCG/ACT AERS INHALE 1 PUFF INTO THE LUNGS ONCE DAILY. 09/24/17  Yes [provider]  amoxicillin (AMOXIL) 875 MG tablet Take 875 mg by mouth 2 (two) times daily. 10/15/17   [provider]  atorvastatin (LIPITOR) 80 MG tablet Take 1 tablet by mouth at bedtime.    [provider]  cholecalciferol (VITAMIN D) 1000 units tablet Take 2,000 Units by mouth daily.    [provider]  feeding supplement, ENSURE ENLIVE, (ENSURE ENLIVE) LIQD Take 237 mLs by mouth 2 (two) times daily between meals. 10/03/17   Delfino Lovett, MD  [employer] [facility]) CREA Apply liberal amount topically to area of skin irritation as needed. Ok to leave at bedside    [provider]  levalbuterol Pauline Aus) 1.25 MG/3ML nebulizer solution Take 1.25 mg by nebulization every 6 (six) hours as needed for wheezing or shortness of breath.    [provider]  omega-3 acid ethyl esters (LOVAZA) 1 g capsule Take 1 g by mouth 2 (two) times daily.    [provider]  OXYGEN Inhale 2 L into the lungs continuous.    [provider]  tiotropium (SPIRIVA) 18 MCG inhalation capsule Place 18 mcg into inhaler and inhale daily.    [provider]    Allergies Statins and Sulfa antibiotics  Family History  Problem Relation Age of Onset  . COPD Mother   . Heart failure Mother   . Alzheimer's disease Father   . COPD Sister     Social History Social History   Tobacco Use  . Smoking status: Former Smoker    Types: Cigarettes  . Smokeless tobacco: Never Used  Substance Use Topics  . Alcohol use: No    Frequency: Never  . Drug use: No    Review of  Systems  Constitutional: No fever/chills. Eyes: No visual changes. ENT: No sore throat. Cardiovascular: Positive for chest pain. Respiratory: Positive for shortness of breath. Gastrointestinal: No abdominal pain.  No nausea, no vomiting.  No diarrhea.  No constipation. Genitourinary: Negative for dysuria. Musculoskeletal: Negative for back pain. Skin: Negative for rash. Neurological: Negative for headaches, focal weakness or numbness.   ____________________________________________   PHYSICAL EXAM:  VITAL SIGNS: ED Triage Vitals  Enc Vitals Group     BP 10/19/17 0108 (!) 208/161     Pulse Rate 10/19/17 0108 (!) 112     Resp 10/19/17 0108 (!) 24     Temp 10/19/17 0108 98.2 F (36.8 C)     Temp Source 10/19/17 0108 Oral     SpO2 10/19/17 0108 100 %     Weight 10/19/17 0112 138 lb (62.6 kg)  Height 10/19/17 0112 5\' 3"  (1.6 m)     Head Circumference --      Peak Flow --      Pain Score 10/19/17 0111 0     Pain Loc --      Pain Edu? --      Excl. in GC? --     Constitutional: Alert and oriented. Frail appearing and in mild acute distress. Eyes: Conjunctivae are normal. PERRL. EOMI. Head: Atraumatic. Nose: No congestion/rhinnorhea. Mouth/Throat: Mucous membranes are moist.  Oropharynx non-erythematous. Neck: No stridor.   Cardiovascular: Tachycardic rate, regular rhythm. Grossly normal heart sounds.  Good peripheral circulation. Respiratory: Increased respiratory effort.  No retractions. Lungs with bibasilar rales. Gastrointestinal: Soft and nontender. No distention. No abdominal bruits. No CVA tenderness. Musculoskeletal: No lower extremity tenderness. 1+ nonpitting BLE edema.  No joint effusions. Neurologic:  Normal speech and language. No gross focal neurologic deficits are appreciated.  Skin:  Skin is warm, dry and intact. No rash noted. Psychiatric: Mood and affect are normal. Speech and behavior are normal.  ____________________________________________    LABS (all labs ordered are listed, but only abnormal results are displayed)  Labs Reviewed  CBC WITH DIFFERENTIAL/PLATELET - Abnormal; Notable for the following components:      Result Value   RBC 3.36 (*)    Hemoglobin 10.0 (*)    HCT 30.9 (*)    Neutro Abs 6.8 (*)    Lymphs Abs 0.6 (*)    All other components within normal limits  URINALYSIS, COMPLETE (UACMP) WITH MICROSCOPIC - Abnormal; Notable for the following components:   Color, Urine YELLOW (*)    APPearance CLOUDY (*)    Glucose, UA 150 (*)    Protein, ur 100 (*)    Bacteria, UA RARE (*)    Squamous Epithelial / LPF 0-5 (*)    All other components within normal limits  BRAIN NATRIURETIC PEPTIDE - Abnormal; Notable for the following components:   B Natriuretic Peptide 1,199.0 (*)    All other components within normal limits  COMPREHENSIVE METABOLIC PANEL - Abnormal; Notable for the following components:   Potassium 5.2 (*)    CO2 19 (*)    Glucose, Bld 258 (*)    BUN 49 (*)    Creatinine, Ser 2.34 (*)    Calcium 8.7 (*)    ALT 13 (*)    GFR calc non Af Amer 18 (*)    GFR calc Af Amer 21 (*)    All other components within normal limits  TROPONIN I - Abnormal; Notable for the following components:   Troponin I 0.05 (*)    All other components within normal limits  CULTURE, BLOOD (ROUTINE X 2)  CULTURE, BLOOD (ROUTINE X 2)  URINE CULTURE  LACTIC ACID, PLASMA  INFLUENZA PANEL BY PCR (TYPE A & B)  LACTIC ACID, PLASMA   ____________________________________________  EKG  ED ECG REPORT I, SUNG,JADE J, the attending physician, personally viewed and interpreted this ECG.   Date: 10/19/2017  EKG Time: 0108  Rate: 112  Rhythm: sinus tachycardia  Axis: Normal  Intervals:right bundle branch block  ST&T Change: Nonspecific  ____________________________________________  RADIOLOGY  ED MD interpretation: CHF  Official radiology report(s): Dg Chest Port 1 View  Result Date: 10/19/2017 CLINICAL DATA:  COPD,  tachycardia, CHF EXAM: PORTABLE CHEST 1 VIEW COMPARISON:  09/28/2017 FINDINGS: Cardiomegaly with small bilateral pleural effusions, vascular congestion and bilateral perihilar and lower lobe opacities. Findings most likely reflect edema/CHF. Findings are similar prior study. IMPRESSION:  CHF.  Small bilateral effusions. Electronically Signed   By: Charlett NoseKevin  Dover M.D.   On: 10/19/2017 02:13    ____________________________________________   PROCEDURES  Procedure(s) performed: None  Procedures  Critical Care performed: Yes, see critical care note(s)   CRITICAL CARE Performed by: Irean HongSUNG,JADE J   Total critical care time: 30 minutes  Critical care time was exclusive of separately billable procedures and treating other patients.  Critical care was necessary to treat or prevent imminent or life-threatening deterioration.  Critical care was time spent personally by me on the following activities: development of treatment plan with patient and/or surrogate as well as nursing, discussions with consultants, evaluation of patient's response to treatment, examination of patient, obtaining history from patient or surrogate, ordering and performing treatments and interventions, ordering and review of laboratory studies, ordering and review of radiographic studies, pulse oximetry and re-evaluation of patient's condition.  ____________________________________________   INITIAL IMPRESSION / ASSESSMENT AND PLAN / ED COURSE  As part of my medical decision making, I reviewed the following data within the electronic MEDICAL RECORD NUMBER History obtained from family, Nursing notes reviewed and incorporated, Labs reviewed, EKG interpreted, Old chart reviewed, Radiograph reviewed, Discussed with admitting physician and Notes from prior ED visits.   84 year old female who presents with shortness of breath. Differential includes, but is not limited to, viral syndrome, bronchitis including COPD exacerbation,  pneumonia, reactive airway disease including asthma, CHF including exacerbation with or without pulmonary/interstitial edema, pneumothorax, ACS, thoracic trauma, and pulmonary embolism.  Patient is tachycardic, tachypneic with elevated blood pressure.  Chest x-ray consistent with pulmonary edema.  Will administer IV Lasix.  Clinical Course as of Oct 19 420  Fri Oct 19, 2017  0421 Morphine was administered for chest pressure which seemed to have helped somewhat.  BMP elevated from prior; renal function and troponin stable from baseline. Will discuss with hospitalist to evaluate patient in the emergency department for admission.  [JS]    Clinical Course User Index [JS] Irean HongSung, Jade J, MD     ____________________________________________   FINAL CLINICAL IMPRESSION(S) / ED DIAGNOSES  Final diagnoses:  SOB (shortness of breath)  Acute on chronic congestive heart failure, unspecified heart failure type [facility](HCC)  Chest pain, unspecified type     ED Discharge Orders    None       Note:  This document was prepared using Dragon voice recognition software and may include unintentional dictation errors.    Irean HongSung, Jade J, MD 10/19/17 [phone]

## 2017-10-19 NOTE — ED Notes (Signed)
Pt daughter is leaving. Daughter requests to use the password [employer] Dana AllanBarrell. Told daughter I would pass it along to the floor.

## 2017-10-19 NOTE — Progress Notes (Signed)
CRITICAL VALUE ALERT  Critical Value:  Tropoonin 8.44  Date & Time Notied:  10/19/17.1311  Provider Notified: Dr. Darrold JunkerParaschos  Orders Received/Actions taken: no new orders received. Will continue to monitor.

## 2017-10-19 NOTE — ED Notes (Signed)
DNR armband placed on patient by this RN.

## 2017-10-19 NOTE — Progress Notes (Signed)
ANTICOAGULATION CONSULT NOTE Pharmacy Consult for Heparin drip Indication: chest pain/ACS  Allergies  Allergen Reactions  . Statins     Other reaction(s): Muscle Pain Unclear if took  . Sulfa Antibiotics     Patient Measurements: Height: 5\' 3"  (160 cm) Weight: 148 lb 9.4 oz (67.4 kg) IBW/kg (Calculated) : 52.4 Heparin Dosing Weight: 62.6 kg  Vital Signs: BP: 99/76 (03/01 1641) Pulse Rate: 95 (03/01 1641)  Labs: Recent Labs    10/19/17 0115 10/19/17 0116 10/19/17 0725 10/19/17 1052 10/19/17 1843  HGB  --  10.0*  --  10.1*  --   HCT  --  30.9*  --  31.4*  --   PLT  --  265  --  260  --   APTT  --   --   --  117*  --   LABPROT  --   --   --  13.8  --   INR  --   --   --  1.07  --   HEPARINUNFRC  --   --   --   --  0.59  CREATININE 2.34*  --   --  2.58*  --   TROPONINI 0.05*  --  4.79* 8.44* 8.89*    Estimated Creatinine Clearance: 14.4 mL/min (A) (by C-G formula based on SCr of 2.58 mg/dL (H)).   Medical History: Past Medical History:  Diagnosis Date  . Alzheimer disease 12/27/2015  . CHF (congestive heart failure) (HCC)   . Chronic bronchitis (HCC) 12/27/2015  . Chronic kidney disease   . Congestive heart failure, NYHA class 1, chronic, diastolic (HCC) 12/27/2015  . Controlled type 2 diabetes mellitus without complication (HCC) 12/27/2015  . COPD (chronic obstructive pulmonary disease) (HCC)   . Depression, major, in remission (HCC) 12/27/2015   on xanax prn  . Diabetes mellitus without complication (HCC)   . Factor V Leiden (HCC) 12/27/2015  . Hernia, hiatal 1989  . Hypertension   . Left-sided carotid artery disease (HCC) 12/27/2015   100% left sided, left so right  . PAD (peripheral artery disease) (HCC)   . PVD (peripheral vascular disease) (HCC) 12/27/2015   [place] stents in [place]    Medications:  Scheduled:  . aspirin EC  81 mg Oral Daily  . atorvastatin  80 mg Oral QHS  . cholecalciferol  2,000 Units Oral Daily  . clopidogrel  75 mg  Oral Daily  . docusate sodium  100 mg Oral BID  . famotidine  20 mg Oral Daily  . feeding supplement (ENSURE ENLIVE)  237 mL Oral BID BM  . furosemide  40 mg Intravenous BID  . hydrocortisone cream   Topical BID  . insulin aspart  0-20 Units Subcutaneous TID WC  . insulin aspart  0-5 Units Subcutaneous QHS  . ipratropium-albuterol  3 mL Nebulization QID  . isosorbide mononitrate  60 mg Oral Daily  . mometasone-formoterol  2 puff Inhalation BID  . multivitamin with minerals  1 tablet Oral Daily  . omega-3 acid ethyl esters  1 g Oral BID  . sodium chloride flush  3 mL Intravenous Q12H   Infusions:  . sodium chloride    . heparin 750 Units/hr (10/19/17 [id number])    Assessment: 84 yo F to start Heparin drip for ACS/ NSTEMi, elevated troponin PTA: ASA, Plavix Hgb 10.0  Plt 265  APTT 117- *note this was drawn after Heparin drip started,   INR 1.07  Goal of Therapy:  Heparin level 0.3-0.7 units/ml Monitor platelets by  anticoagulation protocol: Yes   Plan:  Give 3800 units bolus x 1 Start heparin infusion at 750 units/hr Check anti-Xa level in 8 hours and daily while on heparin Continue to monitor H&H and platelets   3/1 @ 1900 HL= 0.59. Will continue heparin infusion at current rate and recheck a HL in AM.   Luisa Hart D 10/19/2017,7:28 PM

## 2017-10-19 NOTE — ED Notes (Signed)
Pt to the ER for SOB. Pt was admitte

## 2017-10-19 NOTE — Progress Notes (Addendum)
ANTICOAGULATION CONSULT NOTE - Initial Consult  Pharmacy Consult for Heparin drip Indication: chest pain/ACS  Allergies  Allergen Reactions  . Statins     Other reaction(s): Muscle Pain Unclear if took  . Sulfa Antibiotics     Patient Measurements: Height: 5\' 3"  (160 cm) Weight: 138 lb (62.6 kg) IBW/kg (Calculated) : 52.4 Heparin Dosing Weight: 62.6 kg  Vital Signs: Temp: 98.2 F (36.8 C) (03/01 0108) Temp Source: Oral (03/01 0108) BP: 100/73 (03/01 0840) Pulse Rate: 84 (03/01 0840)  Labs: Recent Labs    10/19/17 0115 10/19/17 0116 10/19/17 0725  HGB  --  10.0*  --   HCT  --  30.9*  --   PLT  --  265  --   CREATININE 2.34*  --   --   TROPONINI 0.05*  --  4.79*    Estimated Creatinine Clearance: 14.3 mL/min (A) (by C-G formula based on SCr of 2.34 mg/dL (H)).   Medical History: Past Medical History:  Diagnosis Date  . Alzheimer disease 12/27/2015  . CHF (congestive heart failure) (HCC)   . Chronic bronchitis (HCC) 12/27/2015  . Chronic kidney disease   . Congestive heart failure, NYHA class 1, chronic, diastolic (HCC) 12/27/2015  . Controlled type 2 diabetes mellitus without complication (HCC) 12/27/2015  . COPD (chronic obstructive pulmonary disease) (HCC)   . Depression, major, in remission (HCC) 12/27/2015   on xanax prn  . Diabetes mellitus without complication (HCC)   . Factor V Leiden (HCC) 12/27/2015  . Hernia, hiatal 1989  . Hypertension   . Left-sided carotid artery disease (HCC) 12/27/2015   100% left sided, left so right  . PAD (peripheral artery disease) (HCC)   . PVD (peripheral vascular disease) (HCC) 12/27/2015   post stents in [place]    Medications:  Scheduled:  . aspirin  81 mg Oral Daily  . atorvastatin  80 mg Oral QHS  . cholecalciferol  2,000 Units Oral Daily  . clopidogrel  75 mg Oral Daily  . docusate sodium  100 mg Oral BID  . famotidine  20 mg Oral Daily  . feeding supplement (ENSURE ENLIVE)  237 mL Oral BID BM  .  furosemide  20 mg Intravenous Q12H  . heparin  3,800 Units Intravenous Once  . hydrocortisone cream   Topical BID  . insulin aspart  0-20 Units Subcutaneous TID WC  . insulin aspart  0-5 Units Subcutaneous QHS  . ipratropium-albuterol  3 mL Nebulization QID  . mometasone-formoterol  2 puff Inhalation BID  . omega-3 acid ethyl esters  1 g Oral BID  . predniSONE  40 mg Oral Q breakfast  . sodium chloride flush  3 mL Intravenous Q12H   Infusions:  . sodium chloride    . heparin      Assessment: 84 yo F to start Heparin drip for ACS/ NSTEMi, elevated troponin PTA: ASA, Plavix Hgb 10.0  Plt 265  APTT 117- *note this was drawn after Heparin drip started,   INR 1.07  Goal of Therapy:  Heparin level 0.3-0.7 units/ml Monitor platelets by anticoagulation protocol: Yes   Plan:  Give 3800 units bolus x 1 Start heparin infusion at 750 units/hr Check anti-Xa level in 8 hours and daily while on heparin Continue to monitor H&H and platelets  Lawrencia Mauney A 10/19/2017,9:10 AM

## 2017-10-20 ENCOUNTER — Inpatient Hospital Stay
Admit: 2017-10-20 | Discharge: 2017-10-20 | Disposition: A | Discharge status: Home / Self Care | Payer: Medicare Other | Attending: Internal Medicine | Admitting: Internal Medicine

## 2017-10-20 LAB — CBC
HEMATOCRIT: 25.8 % — AB (ref 35.0–47.0)
Hemoglobin: 8.6 g/dL — ABNORMAL LOW (ref 12.0–16.0)
MCH: 30.1 pg (ref 26.0–34.0)
MCHC: 33.5 g/dL (ref 32.0–36.0)
MCV: 89.7 fL (ref 80.0–100.0)
Platelets: 216 10*3/uL (ref 150–440)
RBC: 2.87 MIL/uL — ABNORMAL LOW (ref 3.80–5.20)
RDW: 14.4 % (ref 11.5–14.5)
WBC: 7.5 10*3/uL (ref 3.6–11.0)

## 2017-10-20 LAB — ECHOCARDIOGRAM COMPLETE
Height: 63 in
WEIGHTICAEL: 2358.4 [oz_av]

## 2017-10-20 LAB — GLUCOSE, CAPILLARY
GLUCOSE-CAPILLARY: 129 mg/dL — AB (ref 65–99)
Glucose-Capillary: 193 mg/dL — ABNORMAL HIGH (ref 65–99)
Glucose-Capillary: 185 mg/dL — ABNORMAL HIGH (ref 65–99)
Glucose-Capillary: 91 mg/dL (ref 65–99)

## 2017-10-20 LAB — BASIC METABOLIC PANEL
Anion gap: 13 (ref 5–15)
BUN: 54 mg/dL — AB (ref 6–20)
CO2: 19 mmol/L — ABNORMAL LOW (ref 22–32)
CREATININE: 2.71 mg/dL — AB (ref 0.44–1.00)
Calcium: 8.5 mg/dL — ABNORMAL LOW (ref 8.9–10.3)
Chloride: 105 mmol/L (ref 101–111)
GFR calc Af Amer: 17 mL/min — ABNORMAL LOW (ref >60)
GFR, EST NON AFRICAN AMERICAN: 15 mL/min — AB (ref >60)
GLUCOSE: 199 mg/dL — AB (ref 65–99)
POTASSIUM: 5.4 mmol/L — AB (ref 3.5–5.1)
Sodium: 137 mmol/L (ref 135–145)

## 2017-10-20 LAB — URINE CULTURE: CULTURE: NO GROWTH

## 2017-10-20 LAB — HEPARIN LEVEL (UNFRACTIONATED): Heparin Unfractionated: 0.47 IU/mL (ref 0.30–0.70)

## 2017-10-20 MED ORDER — CARVEDILOL 3.125 MG PO TABS
3.1250 mg | ORAL_TABLET | Freq: Two times a day (BID) | ORAL | Status: DC
  Administered 2017-10-20 – 2017-10-22 (×5): 3.125 mg via ORAL
  Filled 2017-10-20 (×5): qty 1

## 2017-10-20 MED ORDER — NITROGLYCERIN 0.4 MG SL SUBL
SUBLINGUAL_TABLET | SUBLINGUAL | Status: AC
  Filled 2017-10-20: qty 1

## 2017-10-20 NOTE — Progress Notes (Signed)
ANTICOAGULATION CONSULT NOTE Pharmacy Consult for Heparin drip Indication: chest pain/ACS  Allergies  Allergen Reactions  . Statins     Other reaction(s): Muscle Pain Unclear if took  . Sulfa Antibiotics     Patient Measurements: Height: 5\' 3"  (160 cm) Weight: 148 lb 9.4 oz (67.4 kg) IBW/kg (Calculated) : 52.4 Heparin Dosing Weight: 62.6 kg  Vital Signs: BP: 130/82 (03/01 1936) Pulse Rate: 96 (03/01 1936)  Labs: Recent Labs    10/19/17 0115  10/19/17 0116 10/19/17 0725 10/19/17 1052 10/19/17 1843 10/20/17 0243  HGB  --    < > 10.0*  --  10.1*  --  8.6*  HCT  --   --  30.9*  --  31.4*  --  25.8*  PLT  --   --  265  --  260  --  216  APTT  --   --   --   --  117*  --   --   LABPROT  --   --   --   --  13.8  --   --   INR  --   --   --   --  1.07  --   --   HEPARINUNFRC  --   --   --   --   --  0.59 0.47  CREATININE 2.34*  --   --   --  2.58*  --  2.71*  TROPONINI 0.05*  --   --  4.79* 8.44* 8.89*  --    < > = values in this interval not displayed.    Estimated Creatinine Clearance: 13.7 mL/min (A) (by C-G formula based on SCr of 2.71 mg/dL (H)).   Medical History: Past Medical History:  Diagnosis Date  . Alzheimer disease 12/27/2015  . CHF (congestive heart failure) (HCC)   . Chronic bronchitis (HCC) 12/27/2015  . Chronic kidney disease   . Congestive heart failure, NYHA class 1, chronic, diastolic (HCC) 12/27/2015  . Controlled type 2 diabetes mellitus without complication (HCC) 12/27/2015  . COPD (chronic obstructive pulmonary disease) (HCC)   . Depression, major, in remission (HCC) 12/27/2015   on xanax prn  . Diabetes mellitus without complication (HCC)   . Factor V Leiden (HCC) 12/27/2015  . Hernia, hiatal 1989  . Hypertension   . Left-sided carotid artery disease (HCC) 12/27/2015   100% left sided, left so right  . PAD (peripheral artery disease) (HCC)   . PVD (peripheral vascular disease) (HCC) 12/27/2015   post stents in [place]     Medications:  Scheduled:  . aspirin EC  81 mg Oral Daily  . atorvastatin  80 mg Oral QHS  . cholecalciferol  2,000 Units Oral Daily  . clopidogrel  75 mg Oral Daily  . docusate sodium  100 mg Oral BID  . famotidine  20 mg Oral Daily  . feeding supplement (ENSURE ENLIVE)  237 mL Oral BID BM  . furosemide  40 mg Intravenous BID  . hydrocortisone cream   Topical BID  . insulin aspart  0-20 Units Subcutaneous TID WC  . insulin aspart  0-5 Units Subcutaneous QHS  . ipratropium-albuterol  3 mL Nebulization QID  . isosorbide mononitrate  60 mg Oral Daily  . mometasone-formoterol  2 puff Inhalation BID  . multivitamin with minerals  1 tablet Oral Daily  . omega-3 acid ethyl esters  1 g Oral BID  . sodium chloride flush  3 mL Intravenous Q12H   Infusions:  . sodium chloride    .  heparin 750 Units/hr (10/19/17 9562)    Assessment: 84 yo F to start Heparin drip for ACS/ NSTEMi, elevated troponin PTA: ASA, Plavix Hgb 10.0  Plt 265  APTT 117- *note this was drawn after Heparin drip started,   INR 1.07  Goal of Therapy:  Heparin level 0.3-0.7 units/ml Monitor platelets by anticoagulation protocol: Yes   Plan:  Give 3800 units bolus x 1 Start heparin infusion at 750 units/hr Check anti-Xa level in 8 hours and daily while on heparin Continue to monitor H&H and platelets   3/1 @ 1900 HL= 0.59. Will continue heparin infusion at current rate and recheck a HL in AM.   03/02 @ 0300 HL 0.47 therapeutic. Will continue current rate and will recheck HL w/ am labs on 03/03. Hgb has trended down by 1.5 units, will monitor CBC w/ am labs.  Thomasene Ripple, PharmD, BCPS Clinical Pharmacist 10/20/2017

## 2017-10-20 NOTE — Progress Notes (Signed)
Family Meeting Note  Advance Directive:no  Today a meeting took place with the Daughter.  Patient is unable to participate due [id number]:Lacked capacity dementia   The following clinical team members were present during this meeting:MD  The following were discussed:Patient's diagnosis: CHF, NSTEMI, dementia , Patient's progosis: < 12 months and Goals for treatment: DNR  Detailed discussion with daughter about her high risk at repeated admissions for worsening cardiac conditions and possibly arrhythmia and death, she understands this, pt is DNR and daughter would like to have pallaitive or hospice care involved on d/c and likely not willing to bring her back to ER, She have also been going through process of selling pt's house and hoping that gets finished soon.  Additional follow-up to be provided: palliative care.  Time spent during discussion:20 minutes  Altamese DillingVaibhavkumar Leonor Darnell, MD

## 2017-10-20 NOTE — Progress Notes (Signed)
*  PRELIMINARY RESULTS* Echocardiogram 2D Echocardiogram has been performed. This was a Limited Study performed due to family member refusing test completion. Garrel Ridgelikeshia S Dhani Dannemiller 10/20/2017, 10:54 AM

## 2017-10-20 NOTE — Progress Notes (Signed)
[facility] Cardiology  SUBJECTIVE: Patient laying comfortably flat in bed, denies chest pain or shortness of breath   Vitals:   10/19/17 1939 10/20/17 0456 10/20/17 0718 10/20/17 0823  BP:  (!) 124/50  (!) 133/54  Pulse:  95  (!) 124  Resp:  18    Temp:  98.7 F (37.1 C)  98.2 F (36.8 C)  TempSrc:  Oral  Oral  SpO2: 98% 100% 98% 99%  Weight:  66.9 kg (147 lb 6.4 oz)    Height:         Intake/Output Summary (Last 24 hours) at 10/20/2017 1101 Last data filed at 10/20/2017 1610 Gross per 24 hour  Intake 134.75 ml  Output 600 ml  Net -465.25 ml      PHYSICAL EXAM  General: Well developed, well nourished, in no acute distress HEENT:  Normocephalic and atramatic Neck:  No JVD.  Lungs: Clear bilaterally to auscultation and percussion. Heart: HRRR . Normal S1 and S2 without gallops or murmurs.  Abdomen: Bowel sounds are positive, abdomen soft and non-tender  Msk:  Back normal, normal gait. Normal strength and tone for age. Extremities: No clubbing, cyanosis or edema.   Neuro: Alert and oriented X 3. Psych:  Good affect, responds appropriately   LABS: Basic Metabolic Panel: Recent Labs    10/19/17 0115 10/19/17 1052 10/20/17 0243  NA 137  --  137  K 5.2*  --  5.4*  CL 108  --  105  CO2 19*  --  19*  GLUCOSE 258*  --  199*  BUN 49*  --  54*  CREATININE 2.34* 2.58* 2.71*  CALCIUM 8.7*  --  8.5*   Liver Function Tests: Recent Labs    10/19/17 0115  AST 21  ALT 13*  ALKPHOS 107  BILITOT 0.7  PROT 7.6  ALBUMIN 3.5   No results for input(s): LIPASE, AMYLASE in the last 72 hours. CBC: Recent Labs    10/19/17 0116 10/19/17 1052 10/20/17 0243  WBC 8.1 9.0 7.5  NEUTROABS 6.8*  --   --   HGB 10.0* 10.1* 8.6*  HCT 30.9* 31.4* 25.8*  MCV 92.0 91.9 89.7  PLT 265 260 216   Cardiac Enzymes: Recent Labs    10/19/17 0725 10/19/17 1052 10/19/17 1843  TROPONINI 4.79* 8.44* 8.89*   BNP: Invalid input(s): POCBNP D-Dimer: No results for input(s): DDIMER in the  last 72 hours. Hemoglobin A1C: No results for input(s): HGBA1C in the last 72 hours. Fasting Lipid Panel: No results for input(s): CHOL, HDL, LDLCALC, TRIG, CHOLHDL, LDLDIRECT in the last 72 hours. Thyroid Function Tests: No results for input(s): TSH, T4TOTAL, T3FREE, THYROIDAB in the last 72 hours.  Invalid input(s): FREET3 Anemia Panel: No results for input(s): VITAMINB12, FOLATE, FERRITIN, TIBC, IRON, RETICCTPCT in the last 72 hours.  Dg Chest Port 1 View  Result Date: 10/19/2017 CLINICAL DATA:  COPD, tachycardia, CHF EXAM: PORTABLE CHEST 1 VIEW COMPARISON:  09/28/2017 FINDINGS: Cardiomegaly with small bilateral pleural effusions, vascular congestion and bilateral perihilar and lower lobe opacities. Findings most likely reflect edema/CHF. Findings are similar prior study. IMPRESSION: CHF.  Small bilateral effusions. Electronically Signed   By: Charlett Nose M.D.   On: 10/19/2017 02:13     Echo pending  TELEMETRY: Normal sinus rhythm:  ASSESSMENT AND PLAN:  Active Problems:   CHF (congestive heart failure) (HCC)   NSTEMI (non-ST elevated myocardial infarction) (HCC)    1.  Probable non-ST elevation myocardial infarction, nondiagnostic ECG, without chest pain, on heparin drip,  and patient with Alzheimer's dementia, and underlying stage III-IV chronic kidney disease, at very high risk for contrast-induced renal failure, and potential need for dialysis, not felt to be a candidate for cardiac catheterization, patient's daughter is in agreement. 2.  Respiratory failure, multifactorial, secondary COPD and chronic diastolic congestive heart failure 3.  Stage III-IV chronic kidney disease, with baseline BUN and creatinine 71 and 2.58, GFR 18  Recommendations  1.  Initial conservative management 2.  Defer cardiac catheterization 3.  DC heparin drip 4.  Continue diuresis 5.  Carefully monitor renal status  Sign off for now, please call with any questions   Marcina MillardAlexander Ngan Qualls, MD,  PhD, [facility] 10/20/2017 11:01 AM

## 2017-10-20 NOTE — Progress Notes (Signed)
PT Cancellation Note  Patient Details Name: Cassandra DanielMargie Mccall MRN: [id number] DOB: 10-18-1928   Cancelled Treatment:     Per RN, patient is currently having chest pain and is still not appropriate for physical therapy.  PT will hold evaluation until patient is cleared by nursing.   Glenetta HewSarah Airika Alkhatib, PT, DPT 10/20/2017, 8:54 AM

## 2017-10-21 LAB — GLUCOSE, CAPILLARY
Glucose-Capillary: 92 mg/dL (ref 65–99)
Glucose-Capillary: 129 mg/dL — ABNORMAL HIGH (ref 65–99)
Glucose-Capillary: 167 mg/dL — ABNORMAL HIGH (ref 65–99)
Glucose-Capillary: 149 mg/dL — ABNORMAL HIGH (ref 65–99)

## 2017-10-21 LAB — CBC
HCT: 24.9 % — ABNORMAL LOW (ref 35.0–47.0)
HEMOGLOBIN: 7.9 g/dL — AB (ref 12.0–16.0)
MCH: 28.9 pg (ref 26.0–34.0)
MCHC: 31.9 g/dL — AB (ref 32.0–36.0)
MCV: 90.6 fL (ref 80.0–100.0)
PLATELETS: 211 10*3/uL (ref 150–440)
RBC: 2.74 MIL/uL — ABNORMAL LOW (ref 3.80–5.20)
RDW: 14.6 % — ABNORMAL HIGH (ref 11.5–14.5)
WBC: 7.2 10*3/uL (ref 3.6–11.0)

## 2017-10-21 LAB — BASIC METABOLIC PANEL
ANION GAP: 10 (ref 5–15)
BUN: 57 mg/dL — ABNORMAL HIGH (ref 6–20)
CO2: 23 mmol/L (ref 22–32)
Calcium: 8.3 mg/dL — ABNORMAL LOW (ref 8.9–10.3)
Chloride: 104 mmol/L (ref 101–111)
Creatinine, Ser: 2.88 mg/dL — ABNORMAL HIGH (ref 0.44–1.00)
GFR, EST AFRICAN AMERICAN: 16 mL/min — AB (ref >60)
GFR, EST NON AFRICAN AMERICAN: 14 mL/min — AB (ref >60)
GLUCOSE: 90 mg/dL (ref 65–99)
POTASSIUM: 4.3 mmol/L (ref 3.5–5.1)
Sodium: 137 mmol/L (ref 135–145)

## 2017-10-21 LAB — HEPARIN LEVEL (UNFRACTIONATED): HEPARIN UNFRACTIONATED: 0.38 [IU]/mL (ref 0.30–0.70)

## 2017-10-21 MED ORDER — IPRATROPIUM-ALBUTEROL 0.5-2.5 (3) MG/3ML IN SOLN
3.0000 mL | Freq: Two times a day (BID) | RESPIRATORY_TRACT | Status: DC
  Administered 2017-10-21 – 2017-10-22 (×2): 3 mL via RESPIRATORY_TRACT
  Filled 2017-10-21 (×2): qty 3

## 2017-10-21 NOTE — Progress Notes (Signed)
[facility] - [place] at [facility]   PATIENT NAME: Taci Sterling    MR#:  [id number]  DATE OF BIRTH:  [birth year]  SUBJECTIVE:  CHIEF COMPLAINT:   Chief Complaint  Patient presents with  . Shortness of Breath   No complaints, comfortable today.  REVIEW OF SYSTEMS:    Review of Systems  Constitutional: Positive for malaise/fatigue. Negative for chills and fever.  HENT: Negative for sore throat.   Eyes: Negative for blurred vision, double vision and pain.  Respiratory: Positive for cough and shortness of breath. Negative for hemoptysis and wheezing.   Cardiovascular: Negative for chest pain, palpitations, orthopnea and leg swelling.  Gastrointestinal: Negative for abdominal pain, constipation, diarrhea, heartburn, nausea and vomiting.  Genitourinary: Negative for dysuria and hematuria.  Musculoskeletal: Negative for back pain and joint pain.  Skin: Negative for rash.  Neurological: Positive for weakness. Negative for sensory change, speech change, focal weakness and headaches.  Endo/Heme/Allergies: Does not bruise/bleed easily.  Psychiatric/Behavioral: Negative for depression. The patient is not nervous/anxious.     DRUG ALLERGIES:   Allergies  Allergen Reactions  . Statins     Other reaction(s): Muscle Pain Unclear if took  . Sulfa Antibiotics     VITALS:  Blood pressure (!) 118/54, pulse (!) 111, temperature 98.4 F (36.9 C), temperature source Oral, resp. rate 18, height 5\' 3"  (1.6 m), weight 65.8 kg (145 lb), SpO2 98 %.  PHYSICAL EXAMINATION:   Physical Exam  GENERAL:  84 y.o.-year-old patient lying in the bed with conversational dyspnea. Looks critically ill EYES: Pupils equal, round, reactive to light and accommodation. No scleral icterus. Extraocular muscles intact.  HEENT: Head atraumatic, normocephalic. Oropharynx and nasopharynx clear.  NECK:  Supple, no jugular venous distention. No thyroid enlargement, no tenderness.  LUNGS: Decreased  breath sounds. Increased work of breathing CARDIOVASCULAR: S1, S2 normal. No murmurs, rubs, or gallops.  ABDOMEN: Soft, nontender, nondistended. Bowel sounds present. No organomegaly or mass.  EXTREMITIES: No cyanosis, clubbing or edema b/l.    NEUROLOGIC: Cranial nerves II through XII are intact. No focal Motor or sensory deficits b/l.  Have generalized weakness. PSYCHIATRIC: The patient is alert and oriented x 1.  SKIN: No obvious rash, lesion, or ulcer.   LABORATORY PANEL:   CBC Recent Labs  Lab 10/21/17 0742  WBC 7.2  HGB 7.9*  HCT 24.9*  PLT 211   ------------------------------------------------------------------------------------------------------------------ Chemistries  Recent Labs  Lab 10/19/17 0115  10/21/17 0743  NA 137   < > 137  K 5.2*   < > 4.3  CL 108   < > 104  CO2 19*   < > 23  GLUCOSE 258*   < > 90  BUN 49*   < > 57*  CREATININE 2.34*   < > 2.88*  CALCIUM 8.7*   < > 8.3*  AST 21  --   --   ALT 13*  --   --   ALKPHOS 107  --   --   BILITOT 0.7  --   --    < > = values in this interval not displayed.   ------------------------------------------------------------------------------------------------------------------  Cardiac Enzymes Recent Labs  Lab 10/19/17 1843  TROPONINI 8.89*   ------------------------------------------------------------------------------------------------------------------  RADIOLOGY:  No results found.   ASSESSMENT AND PLAN:   * NSTEMI Patient on heparinDrip IV, stopped after 48 hours now.  ASA, Plavix. BB held due to hypotension Cardiology consult No plans for cath. Medical management due to CKD Significant drop in ejection fraction as per  echocardiogram.  As had tachycardia and blood pressure was stable so resume beta blocker.  * CKD 4 Monitor Cr while diuresing Monitor I/o  * Acute on chronic systolic chf with acute hypoxic resp failure - IV Lasix - Input and Output - Counseled to limit fluids and Salt -  Monitor Bun/Cr and Potassium - Echo -Cardiology follow up after discharge  * Tachycardia with chest pain   Likely this was reaction to her chest pain.   Started on beta blocker to help with rate control and given nitroglycerin to help with the pain.  * Anemia likely due to blood loss    Patient was on heparin IV drip for last 2 days, will check stool for guaiac.    Will currently continue aspirin and Plavix . All the records are reviewed and case discussed with Care Management/Social Worker Management plans discussed with the patient, family and they are in agreement.  CODE STATUS: DNR  DVT Prophylaxis: SCDs  TOTAL CRITICAL CARE TIME TAKING CARE OF THIS PATIENT: 35 minutes. Likely discharge tomorrow after having a meeting with palliative care  POSSIBLE D/C IN 2-3 DAYS, DEPENDING ON CLINICAL CONDITION.  Altamese DillingVaibhavkumar Atticus Wedin M.D on 10/21/2017 at 4:44 PM  Between 7am to 6pm - Pager - [phone]  After 6pm go to [email or web address] - password [email or web address] [facility]  [facility] [place] [facility]  Office  [phone]  CC: Primary care physician; Lauro RegulusAnderson, Marshall W, MD  Note: This dictation was prepared with Dragon dictation along with smaller phrase technology. Any transcriptional errors that result from this process are unintentional.

## 2017-10-21 NOTE — Progress Notes (Signed)
PT Cancellation Note  Patient Details Name: Cassandra DanielMargie Knudtson MRN: [id number] DOB: April 22, 1929   Cancelled Treatment:    Reason Eval/Treat Not Completed: Other (comment);Patient not medically ready. Per cardiology note, pt is s/p probable NSTEMI. Last troponin cycle (3/1) still uptrending, with no values from 3/2. Will cancel PT eval order and await new order once patient is medically ready to participate in evaluation.   8:37 AM, 10/21/17 Rosamaria LintsAllan C Shaneka Efaw, PT, DPT Physical Therapist - [facility]  [phone] (ASCOM)      Jovanni Rash C 10/21/2017, 8:36 AM

## 2017-10-21 NOTE — Plan of Care (Addendum)
Pt is alert, oriented to self only. VSS. NSR on monitor. 3L O2 [place] continued. Family at bedside. OOB with assist. PRN xanax given one time this shift for anxiety per order. Will continue to monitor and report to oncoming RN .  Progressing Education: Knowledge of General Education information will improve 10/21/2017 1510 - Progressing by Jodie EchevariaWhite, Makenze Ellett L, RN Health Behavior/Discharge Planning: Ability to manage health-related needs will improve 10/21/2017 1510 - Progressing by Jodie EchevariaWhite, Virginio Isidore L, RN Clinical Measurements: Ability to maintain clinical measurements within normal limits will improve 10/21/2017 1510 - Progressing by Jodie EchevariaWhite, Emary Zalar L, RN Will remain free from infection 10/21/2017 1510 - Progressing by Jodie EchevariaWhite, Jann Milkovich L, RN Diagnostic test results will improve 10/21/2017 1510 - Progressing by Jodie EchevariaWhite, Tenaya Hilyer L, RN Respiratory complications will improve 10/21/2017 1510 - Progressing by Jodie EchevariaWhite, Renel Ende L, RN Cardiovascular complication will be avoided 10/21/2017 1510 - Progressing by Jodie EchevariaWhite, Mercadez Heitman L, RN Activity: Risk for activity intolerance will decrease 10/21/2017 1510 - Progressing by Jodie EchevariaWhite, Lyann Hagstrom L, RN Nutrition: Adequate nutrition will be maintained 10/21/2017 1510 - Progressing by Jodie EchevariaWhite, Pelham Hennick L, RN Coping: Level of anxiety will decrease 10/21/2017 1510 - Progressing by Jodie EchevariaWhite, Elijah Phommachanh L, RN Elimination: Will not experience complications related to bowel motility 10/21/2017 1510 - Progressing by Jodie EchevariaWhite, Janssen Zee L, RN Will not experience complications related to urinary retention 10/21/2017 1510 - Progressing by Jodie EchevariaWhite, Nyelli Samara L, RN Pain Managment: General experience of comfort will improve 10/21/2017 1510 - Progressing by Jodie EchevariaWhite, Josmar Messimer L, RN Safety: Ability to remain free from injury will improve 10/21/2017 1510 - Progressing by Jodie EchevariaWhite, Falana Clagg L, RN Skin Integrity: Risk for impaired skin integrity will decrease 10/21/2017 1510 - Progressing by Jodie EchevariaWhite, Armine Rizzolo L, RN

## 2017-10-21 NOTE — Progress Notes (Signed)
[facility] - [place] at [facility]   PATIENT NAME: Cassandra Mccall    MR#:  [id number]  DATE OF BIRTH:  [birth year]  SUBJECTIVE:  CHIEF COMPLAINT:   Chief Complaint  Patient presents with  . Shortness of Breath   Still has SOB. Had some chest pain this morning and tachycardia which responded to nitroglycerin.  REVIEW OF SYSTEMS:    Review of Systems  Constitutional: Positive for malaise/fatigue. Negative for chills and fever.  HENT: Negative for sore throat.   Eyes: Negative for blurred vision, double vision and pain.  Respiratory: Positive for cough and shortness of breath. Negative for hemoptysis and wheezing.   Cardiovascular: Negative for chest pain, palpitations, orthopnea and leg swelling.  Gastrointestinal: Negative for abdominal pain, constipation, diarrhea, heartburn, nausea and vomiting.  Genitourinary: Negative for dysuria and hematuria.  Musculoskeletal: Negative for back pain and joint pain.  Skin: Negative for rash.  Neurological: Positive for weakness. Negative for sensory change, speech change, focal weakness and headaches.  Endo/Heme/Allergies: Does not bruise/bleed easily.  Psychiatric/Behavioral: Negative for depression. The patient is not nervous/anxious.     DRUG ALLERGIES:   Allergies  Allergen Reactions  . Statins     Other reaction(s): Muscle Pain Unclear if took  . Sulfa Antibiotics     VITALS:  Blood pressure (!) 118/54, pulse (!) 111, temperature 98.4 F (36.9 C), temperature source Oral, resp. rate 18, height 5\' 3"  (1.6 m), weight 65.8 kg (145 lb), SpO2 98 %.  PHYSICAL EXAMINATION:   Physical Exam  GENERAL:  84 y.o.-year-old patient lying in the bed with conversational dyspnea. Looks critically ill EYES: Pupils equal, round, reactive to light and accommodation. No scleral icterus. Extraocular muscles intact.  HEENT: Head atraumatic, normocephalic. Oropharynx and nasopharynx clear.  NECK:  Supple, no jugular venous  distention. No thyroid enlargement, no tenderness.  LUNGS: Decreased breath sounds. Increased work of breathing CARDIOVASCULAR: S1, S2 normal. No murmurs, rubs, or gallops.  ABDOMEN: Soft, nontender, nondistended. Bowel sounds present. No organomegaly or mass.  EXTREMITIES: No cyanosis, clubbing or edema b/l.    NEUROLOGIC: Cranial nerves II through XII are intact. No focal Motor or sensory deficits b/l.  Have generalized weakness. PSYCHIATRIC: The patient is alert and oriented x 1.  SKIN: No obvious rash, lesion, or ulcer.   LABORATORY PANEL:   CBC Recent Labs  Lab 10/21/17 0742  WBC 7.2  HGB 7.9*  HCT 24.9*  PLT 211   ------------------------------------------------------------------------------------------------------------------ Chemistries  Recent Labs  Lab 10/19/17 0115  10/21/17 0743  NA 137   < > 137  K 5.2*   < > 4.3  CL 108   < > 104  CO2 19*   < > 23  GLUCOSE 258*   < > 90  BUN 49*   < > 57*  CREATININE 2.34*   < > 2.88*  CALCIUM 8.7*   < > 8.3*  AST 21  --   --   ALT 13*  --   --   ALKPHOS 107  --   --   BILITOT 0.7  --   --    < > = values in this interval not displayed.   ------------------------------------------------------------------------------------------------------------------  Cardiac Enzymes Recent Labs  Lab 10/19/17 1843  TROPONINI 8.89*   ------------------------------------------------------------------------------------------------------------------  RADIOLOGY:  No results found.   ASSESSMENT AND PLAN:   * NSTEMI Patient started on heparin ASA BB held due to hypotension Cardiology consult No plans for cath. Medical management due to CKD Significant drop in  ejection fraction as per echocardiogram.  As had tachycardia and blood pressure was stable so resume beta blocker.  * CKD 4 Monitor Cr while diuresing Monitor I/o  * Acute on chronic systolic chf with acute hypoxic resp failure - IV Lasix - Input and Output -  Counseled to limit fluids and Salt - Monitor Bun/Cr and Potassium - Echo -Cardiology follow up after discharge  * Tachycardia with chest pain   Likely this was reaction to her chest pain.   Started on beta blocker to help with rate control and given nitroglycerin to help with the pain.  All the records are reviewed and case discussed with Care Management/Social Worker Management plans discussed with the patient, family and they are in agreement.  CODE STATUS: DNR  DVT Prophylaxis: SCDs  TOTAL CRITICAL CARE TIME TAKING CARE OF THIS PATIENT: 35 minutes.  Long discussion with patient's daughter in the room.  POSSIBLE D/C IN 2-3 DAYS, DEPENDING ON CLINICAL CONDITION.  Altamese DillingVaibhavkumar Reyaansh Merlo M.D on 10/21/2017 at 4:41 PM  Between 7am to 6pm - Pager - [phone]  After 6pm go to [email or web address] - password [email or web address] [facility]  [facility] [place] [facility]  Office  [phone]  CC: Primary care physician; Lauro RegulusAnderson, Marshall W, MD  Note: This dictation was prepared with Dragon dictation along with smaller phrase technology. Any transcriptional errors that result from this process are unintentional.

## 2017-10-22 LAB — GLUCOSE, CAPILLARY
Glucose-Capillary: 103 mg/dL — ABNORMAL HIGH (ref 65–99)
Glucose-Capillary: 141 mg/dL — ABNORMAL HIGH (ref 65–99)

## 2017-10-22 LAB — BASIC METABOLIC PANEL
Anion gap: 9 (ref 5–15)
BUN: 56 mg/dL — AB (ref 6–20)
CO2: 24 mmol/L (ref 22–32)
Calcium: 8.2 mg/dL — ABNORMAL LOW (ref 8.9–10.3)
Chloride: 105 mmol/L (ref 101–111)
Creatinine, Ser: 2.9 mg/dL — ABNORMAL HIGH (ref 0.44–1.00)
GFR, EST AFRICAN AMERICAN: 16 mL/min — AB (ref >60)
GFR, EST NON AFRICAN AMERICAN: 14 mL/min — AB (ref >60)
Glucose, Bld: 100 mg/dL — ABNORMAL HIGH (ref 65–99)
POTASSIUM: 4.2 mmol/L (ref 3.5–5.1)
SODIUM: 138 mmol/L (ref 135–145)

## 2017-10-22 LAB — CBC
HEMATOCRIT: 25 % — AB (ref 35.0–47.0)
Hemoglobin: 8.1 g/dL — ABNORMAL LOW (ref 12.0–16.0)
MCH: 29.4 pg (ref 26.0–34.0)
MCHC: 32.2 g/dL (ref 32.0–36.0)
MCV: 91.4 fL (ref 80.0–100.0)
Platelets: 184 10*3/uL (ref 150–440)
RBC: 2.74 MIL/uL — AB (ref 3.80–5.20)
RDW: 14.7 % — AB (ref 11.5–14.5)
WBC: 5.9 10*3/uL (ref 3.6–11.0)

## 2017-10-22 MED ORDER — ISOSORBIDE MONONITRATE ER 60 MG PO TB24: 60.0000 mg | ORAL_TABLET | Freq: Every day | ORAL | 0 refills | Status: AC

## 2017-10-22 MED ORDER — LACTULOSE 10 GM/15ML PO SOLN
30.0000 g | Freq: Once | ORAL | Status: AC
  Administered 2017-10-22: 30 g via ORAL
  Filled 2017-10-22: qty 60

## 2017-10-22 MED ORDER — DOCUSATE SODIUM 100 MG PO CAPS: 100.0000 mg | ORAL_CAPSULE | Freq: Two times a day (BID) | ORAL | 0 refills | Status: AC

## 2017-10-22 MED ORDER — CARVEDILOL 3.125 MG PO TABS: 3.1250 mg | ORAL_TABLET | Freq: Two times a day (BID) | ORAL | 0 refills | Status: AC

## 2017-10-22 MED ORDER — FUROSEMIDE 20 MG PO TABS: 20.0000 mg | ORAL_TABLET | Freq: Two times a day (BID) | ORAL | 0 refills | Status: AC

## 2017-10-22 MED ORDER — ADULT MULTIVITAMIN W/MINERALS CH: 1.0000 | ORAL_TABLET | Freq: Every day | ORAL | 0 refills | Status: AC

## 2017-10-22 NOTE — Discharge Instructions (Signed)
Hospice to follow at home,

## 2017-10-22 NOTE — Plan of Care (Signed)
Sinus tachycardia continues on telemetry.  No complaints of pain.  No distress noted.

## 2017-10-22 NOTE — Clinical Social Work Note (Signed)
CSW received referral for SNF.  Case discussed with case manager and plan is to discharge home with hospice services.  CSW to sign off please re-consult if social work needs arise.  Ervin KnackEric R. Orest Dygert, MSW, [employer]CSWA [phone]

## 2017-10-22 NOTE — Care Management Important Message (Signed)
Important Message  Patient Details  Name: Ladora DanielMargie Lauderbaugh MRN: [id number] Date of Birth: 1928-11-06   Medicare Important Message Given:  Yes  Signed IM notice given   Eber HongGreene, Valerio Pinard R, RN 10/22/2017, 10:17 AM

## 2017-10-22 NOTE — Care Management (Signed)
Readmit with CHF and nstemi.  Patient is DNR. Palliative consult pending and hospice home is mentioned in the consult order.  Notified [facility] and Servando SnareKaren R with [facility] palliative liaison

## 2017-10-22 NOTE — Progress Notes (Signed)
Patient is currently followed by out patient palliative. This is her 3 admission in the last 3 months. PT recommended SNF, but family has chosen for patient to return home with hospice services. She is an 84 year old woman with a PMH significant for Alzheimer's dementia, CHF (EF 25-30%), CKD stage III, HTN, PVD s/p stents, DM II, COPD and depression, admitted to [facility] on 3[facility]1 with shortness of breath r/t CHF exacerbation Patient has been treated for non STEMI, no invasive procedures planned at this time.She has been treated with IV lasix and heparin drip. Family expressed interest in having patient discharge with hospice services. Writer met in the room with patient's daughter [name] and son [name] to initiate education regarding hospice services, philosophy and team approach to care with good understanding voiced. Questions answered. Patient hs oxygen in place in the home through [facility], also nebulizer machine. No DME needed for discharge. Patient information faxed to referral. Patient was alert at times during the visit, but did not actively participate in the conversation. Of note [name] expressed concern regarding her mother's lack of bowel movement since admission. This was relayed to staff RN Baxter Flattery who will speak with attending physician Dr. Anselm Jungling for PRN medication. [name] reports that her mother does have regular bowel movements at home and does not need to take any softeners or laxatives. Plan is for discharge home via car, patient has a transport wheelchair and portable oxygen in the room. Signed portable DNR in place in patient's chart. [name] RN, BSN, [facility] [place], hospital liaison [phone]

## 2017-10-22 NOTE — Discharge Planning (Signed)
After administering ordered lactulose and soap suds enema, pt had a large BM. Discharge instructions reviewed with pt's daughter. Daughter verbalizes understanding. Pt ready for discharge home.

## 2017-10-22 NOTE — Care Management (Signed)
Patient to discharge home with hospice services.  Per [facility], [facility]  Daughter will transport patient home.  Portable DNR on the chart.  Notified Servando SnareKaren R liaison informed of referral and for discharge today.

## 2017-10-22 NOTE — Progress Notes (Signed)
[facility]  Patient Status: Active  [facility] is providing the following services: SN, PT, OT  If patient discharges after hours, please call [phone].   Cassandra CaseyJason E Mccall 10/22/2017, 9:29 AM

## 2017-10-24 LAB — CULTURE, BLOOD (ROUTINE X 2)
CULTURE: NO GROWTH
Culture: NO GROWTH
SPECIAL REQUESTS: ADEQUATE
SPECIAL REQUESTS: ADEQUATE

## 2017-10-29 ENCOUNTER — Ambulatory Visit: Payer: Medicare Other | Admitting: Family

## 2017-10-31 NOTE — Discharge Summary (Signed)
[facility] - [place] at [facility]   PATIENT NAME: Cassandra Mccall    MR#:  [id number]  DATE OF BIRTH:  [birth year]  DATE OF ADMISSION:  10/19/2017 ADMITTING PHYSICIAN: Bertrum Sol, MD  DATE OF DISCHARGE: 10/22/2017  4:12 PM  PRIMARY CARE PHYSICIAN: Lauro Regulus, MD    ADMISSION DIAGNOSIS:  SOB (shortness of breath) [R06.02] Chest pain, unspecified type [R07.9] Acute on chronic congestive heart failure, unspecified heart failure type (HCC) [I50.9]  DISCHARGE DIAGNOSIS:  Active Problems:   CHF (congestive heart failure) (HCC)   NSTEMI (non-ST elevated myocardial infarction) (HCC)   SECONDARY DIAGNOSIS:   Past Medical History:  Diagnosis Date  . Alzheimer disease 12/27/2015  . CHF (congestive heart failure) (HCC)   . Chronic bronchitis (HCC) 12/27/2015  . Chronic kidney disease   . Congestive heart failure, NYHA class 1, chronic, diastolic (HCC) 12/27/2015  . Controlled type 2 diabetes mellitus without complication (HCC) 12/27/2015  . COPD (chronic obstructive pulmonary disease) (HCC)   . Depression, major, in remission (HCC) 12/27/2015   on xanax prn  . Diabetes mellitus without complication (HCC)   . Factor V Leiden (HCC) 12/27/2015  . Hernia, hiatal 1989  . Hypertension   . Left-sided carotid artery disease (HCC) 12/27/2015   100% left sided, left so right  . PAD (peripheral artery disease) (HCC)   . PVD (peripheral vascular disease) (HCC) 12/27/2015   post stents in [facility]    HOSPITAL COURSE:   * NSTEMI Patient on heparinDrip IV, stopped after 48 hours now.  ASA, Plavix. BB held due to hypotension Cardiology consult No plans for cath. Medical management due to CKD Significant drop in ejection fraction as per echocardiogram.  As had tachycardia and blood pressure was stable so resume beta blocker.  * CKD 4 Monitor Cr while diuresing Monitor I/o  * Acute on chronic systolic chf with acute hypoxic resp failure -  IV Lasix - Input and Output - Counseled to limit fluids and Salt - Monitor Bun/Cr and Potassium - Echo -Cardiology follow up after discharge  * Tachycardia with chest pain   Likely this was reaction to her chest pain.   Started on beta blocker to help with rate control and given nitroglycerin to help with the pain.  * Anemia likely due to blood loss    Patient was on heparin IV drip for last 2 days, will check stool for guaiac.    Will currently continue aspirin and Plavix .    DISCHARGE CONDITIONS:   Stable.  CONSULTS OBTAINED:  Treatment Team:  Marcina Millard, MD  DRUG ALLERGIES:   Allergies  Allergen Reactions  . Statins     Other reaction(s): Muscle Pain Unclear if took  . Sulfa Antibiotics     DISCHARGE MEDICATIONS:   Allergies as of 10/22/2017      Reactions   Statins    Other reaction(s): Muscle Pain Unclear if took   Sulfa Antibiotics       Medication List    STOP taking these medications   amLODipine 2.5 MG tablet Commonly known as:  NORVASC   metoprolol succinate 25 MG 24 hr tablet Commonly known as:  TOPROL-XL   predniSONE 20 MG tablet Commonly known as:  DELTASONE     TAKE these medications   acetaminophen 325 MG tablet Commonly known as:  TYLENOL Take 650 mg by mouth every 4 (four) hours as needed.   ADVAIR HFA 230-21 MCG/ACT inhaler Generic drug:  fluticasone-salmeterol Inhale 2  puffs into the lungs 2 (two) times daily.   albuterol 108 (90 Base) MCG/ACT inhaler Commonly known as:  PROVENTIL HFA;VENTOLIN HFA Inhale 2 puffs into the lungs every 6 (six) hours as needed for wheezing or shortness of breath.   ALPRAZolam 0.25 MG tablet Commonly known as:  XANAX Take 0.125-0.25 mg by mouth 3 (three) times daily as needed for anxiety or sleep. 1/2 tab   aspirin 81 MG EC tablet Take 1 tablet (81 mg total) by mouth daily.   atorvastatin 80 MG tablet Commonly known as:  LIPITOR Take 1 tablet by mouth at bedtime.   carvedilol  3.125 MG tablet Commonly known as:  COREG Take 1 tablet (3.125 mg total) by mouth 2 (two) times daily with a meal.   cholecalciferol 1000 units tablet Commonly known as:  VITAMIN D Take 2,000 Units by mouth daily.   clopidogrel 75 MG tablet Commonly known as:  PLAVIX Take 1 tablet by mouth daily.   DERMACLOUD Crea Apply liberal amount topically to area of skin irritation as needed. Ok to leave at bedside   desonide 0.05 % ointment Commonly known as:  DESOWEN Apply 1 application topically 2 (two) times daily.   docusate sodium 100 MG capsule Commonly known as:  COLACE Take 1 capsule (100 mg total) by mouth 2 (two) times daily.   feeding supplement (ENSURE ENLIVE) Liqd Take 237 mLs by mouth 2 (two) times daily between meals.   furosemide 20 MG tablet Commonly known as:  LASIX Take 1 tablet (20 mg total) by mouth 2 (two) times daily. What changed:    when to take this  reasons to take this   ipratropium-albuterol 0.5-2.5 (3) MG/3ML Soln Commonly known as:  DUONEB Take 3 mLs by nebulization every 8 (eight) hours as needed.   isosorbide mononitrate 60 MG 24 hr tablet Commonly known as:  IMDUR Take 1 tablet (60 mg total) by mouth daily.   multivitamin with minerals Tabs tablet Take 1 tablet by mouth daily.   omega-3 acid ethyl esters 1 g capsule Commonly known as:  LOVAZA Take 1 g by mouth 2 (two) times daily.   OXYGEN Inhale 2 L into the lungs continuous.   ranitidine 75 MG tablet Commonly known as:  ZANTAC Take 150 mg by mouth daily.   sodium chloride 0.65 % Soln nasal spray Commonly known as:  OCEAN Place 2 sprays into both nostrils as needed for congestion.   SPIRIVA RESPIMAT 2.5 MCG/ACT Aers Generic drug:  Tiotropium Bromide Monohydrate INHALE 1 PUFF INTO THE LUNGS ONCE DAILY. What changed:  Another medication with the same name was removed. Continue taking this medication, and follow the directions you see here.   XOPENEX 1.25 MG/3ML nebulizer  solution Generic drug:  levalbuterol Take 1.25 mg by nebulization every 6 (six) hours as needed for wheezing or shortness of breath.        DISCHARGE INSTRUCTIONS:    Follow with cardiology after discharge.  If you experience worsening of your admission symptoms, develop shortness of breath, life threatening emergency, suicidal or homicidal thoughts you must seek medical attention immediately by calling 911 or calling your MD immediately  if symptoms less severe.  You Must read complete instructions/literature along with all the possible adverse reactions/side effects for all the Medicines you take and that have been prescribed to you. Take any new Medicines after you have completely understood and accept all the possible adverse reactions/side effects.   Please note  You were cared for by a hospitalist  during your hospital stay. If you have any questions about your discharge medications or the care you received while you were in the hospital after you are discharged, you can call the unit and asked to speak with the hospitalist on call if the hospitalist that took care of you is not available. Once you are discharged, your primary care physician will handle any further medical issues. Please note that NO REFILLS for any discharge medications will be authorized once you are discharged, as it is imperative that you return to your primary care physician (or establish a relationship with a primary care physician if you do not have one) for your aftercare needs so that they can reassess your need for medications and monitor your lab values.    Today   CHIEF COMPLAINT:   Chief Complaint  Patient presents with  . Shortness of Breath    HISTORY OF PRESENT ILLNESS:  Donelda Mailhot  is a 84 y.o. female presents to the emergency department with shortness of breath per patient's daughter, in the emergency room patient was noted to have congestive heart failure on chest x-ray, elevated BNP  greater than 1000, potassium 5.2, troponin 0.05, urinalysis negative, patient evaluated at the bedside emergency room, no apparent distress, resting comfortably in bed on her chronic O2 of 3 L via nasal cannula-currently satting 100%, patient is now being admitted for acute recurrent diastolic congestive heart failure exacerbation.    VITAL SIGNS:  Blood pressure (!) 129/59, pulse 93, temperature 98.5 F (36.9 C), temperature source Oral, resp. rate 18, height 5\' 3"  (1.6 m), weight 65.9 kg (145 lb 4.5 oz), SpO2 98 %.  I/O:  No intake or output data in the 24 hours ending 10/31/17 0935  PHYSICAL EXAMINATION:   GENERAL:  84 y.o.-year-old patient lying in the bed with conversational dyspnea. Looks critically ill EYES: Pupils equal, round, reactive to light and accommodation. No scleral icterus. Extraocular muscles intact.  HEENT: Head atraumatic, normocephalic. Oropharynx and nasopharynx clear.  NECK:  Supple, no jugular venous distention. No thyroid enlargement, no tenderness.  LUNGS: Decreased breath sounds. Increased work of breathing CARDIOVASCULAR: S1, S2 normal. No murmurs, rubs, or gallops.  ABDOMEN: Soft, nontender, nondistended. Bowel sounds present. No organomegaly or mass.  EXTREMITIES: No cyanosis, clubbing or edema b/l.    NEUROLOGIC: Cranial nerves II through XII are intact. No focal Motor or sensory deficits b/l.  Have generalized weakness. PSYCHIATRIC: The patient is alert and oriented x 1.  SKIN: No obvious rash, lesion, or ulcer.     DATA REVIEW:   CBC No results for input(s): WBC, HGB, HCT, PLT in the last 168 hours.  Chemistries  No results for input(s): NA, K, CL, CO2, GLUCOSE, BUN, CREATININE, CALCIUM, MG, AST, ALT, ALKPHOS, BILITOT in the last 168 hours.  Invalid input(s): GFRCGP  Cardiac Enzymes No results for input(s): TROPONINI in the last 168 hours.  Microbiology Results  Results for orders placed or performed during the hospital encounter of  10/19/17  Culture, blood (routine x 2)     Status: None   Collection Time: 10/19/17  2:10 AM  Result Value Ref Range Status   Specimen Description BLOOD LEFT WRIST  Final   Special Requests   Final    BOTTLES DRAWN AEROBIC AND ANAEROBIC Blood Culture adequate volume   Culture   Final    NO GROWTH 5 DAYS Performed at [facility], [address], [place], [place] [address]    Report Status 10/24/2017 FINAL  Final  Urine culture     Status: None   Collection Time: 10/19/17  2:10 AM  Result Value Ref Range Status   Specimen Description   Final    URINE, RANDOM Performed at [facility], [address] [place], [place] [address]    Special Requests   Final    NONE Performed at [facility], [address] [place], [place] [address]    Culture   Final    NO GROWTH Performed at [facility], [address] [place], [place] [address]    Report Status 10/20/2017 FINAL  Final  Culture, blood (routine x 2)     Status: None   Collection Time: 10/19/17  2:11 AM  Result Value Ref Range Status   Specimen Description BLOOD RIGHT WRIST  Final   Special Requests   Final    BOTTLES DRAWN AEROBIC AND ANAEROBIC Blood Culture adequate volume   Culture   Final    NO GROWTH 5 DAYS Performed at [facility], [address] [place], [place] [address]    Report Status 10/24/2017 FINAL  Final    RADIOLOGY:  No results found.  EKG:   Orders placed or performed during the hospital encounter of 10/19/17  . EKG 12-Lead  . EKG 12-Lead  . EKG    Management plans discussed with the patient, family and they are in agreement.  CODE STATUS:  Code Status History    Date Active Date Inactive Code Status Order ID Comments User Context   10/19/2017 08:43 10/22/2017 19:12 DNR [id number]  Bertrum SolSalary, Montell D, MD ED   09/27/2017 04:44 10/03/2017 15:55 DNR [id number]  Arnaldo Nataliamond, Michael S, MD Inpatient   08/15/2017 10:55 08/21/2017 18:55 DNR [id number]  Shaune Pollackhen, Qing, MD  Inpatient    Questions for Most Recent Historical Code Status (Order [id number])    Question Answer Comment   In the event of cardiac or respiratory ARREST Do not call a "code blue"    In the event of cardiac or respiratory ARREST Do not perform Intubation, CPR, defibrillation or ACLS    In the event of cardiac or respiratory ARREST Use medication by any route, position, wound care, and other measures to relive pain and suffering. May use oxygen, suction and manual treatment of airway obstruction as needed for comfort.         Advance Directive Documentation     Most Recent Value  Type of Advance Directive  Out of facility DNR (pink MOST or yellow form), Healthcare Power of Attorney  Pre-existing out of facility DNR order (yellow form or pink MOST form)  No data  "MOST" Form in Place?  No data      TOTAL TIME TAKING CARE OF THIS PATIENT: 35 minutes.    Altamese DillingVaibhavkumar Evelise Reine M.D on 10/31/2017 at 9:35 AM  Between 7am to 6pm - Pager - [phone]  After 6pm go to [email or web address] - Social research officer, governmentpassword [email or web address] [facility]  [facility] [place] [facility]  Office  [phone]  CC: Primary care physician; Lauro RegulusAnderson, Marshall W, MD   Note: This dictation was prepared with Dragon dictation along with smaller phrase technology. Any transcriptional errors that result from this process are unintentional.

## 2017-12-19 DEATH — deceased

## 2019-03-01 IMAGING — DX DG CHEST 1V PORT
1 series · 1 of 1 positions shown · non-contrast
Comparison: Report of chest x-ray dated 08/12/2017

CLINICAL DATA: Shortness of breath.

EXAM:
PORTABLE CHEST 1 VIEW

[chest ap]
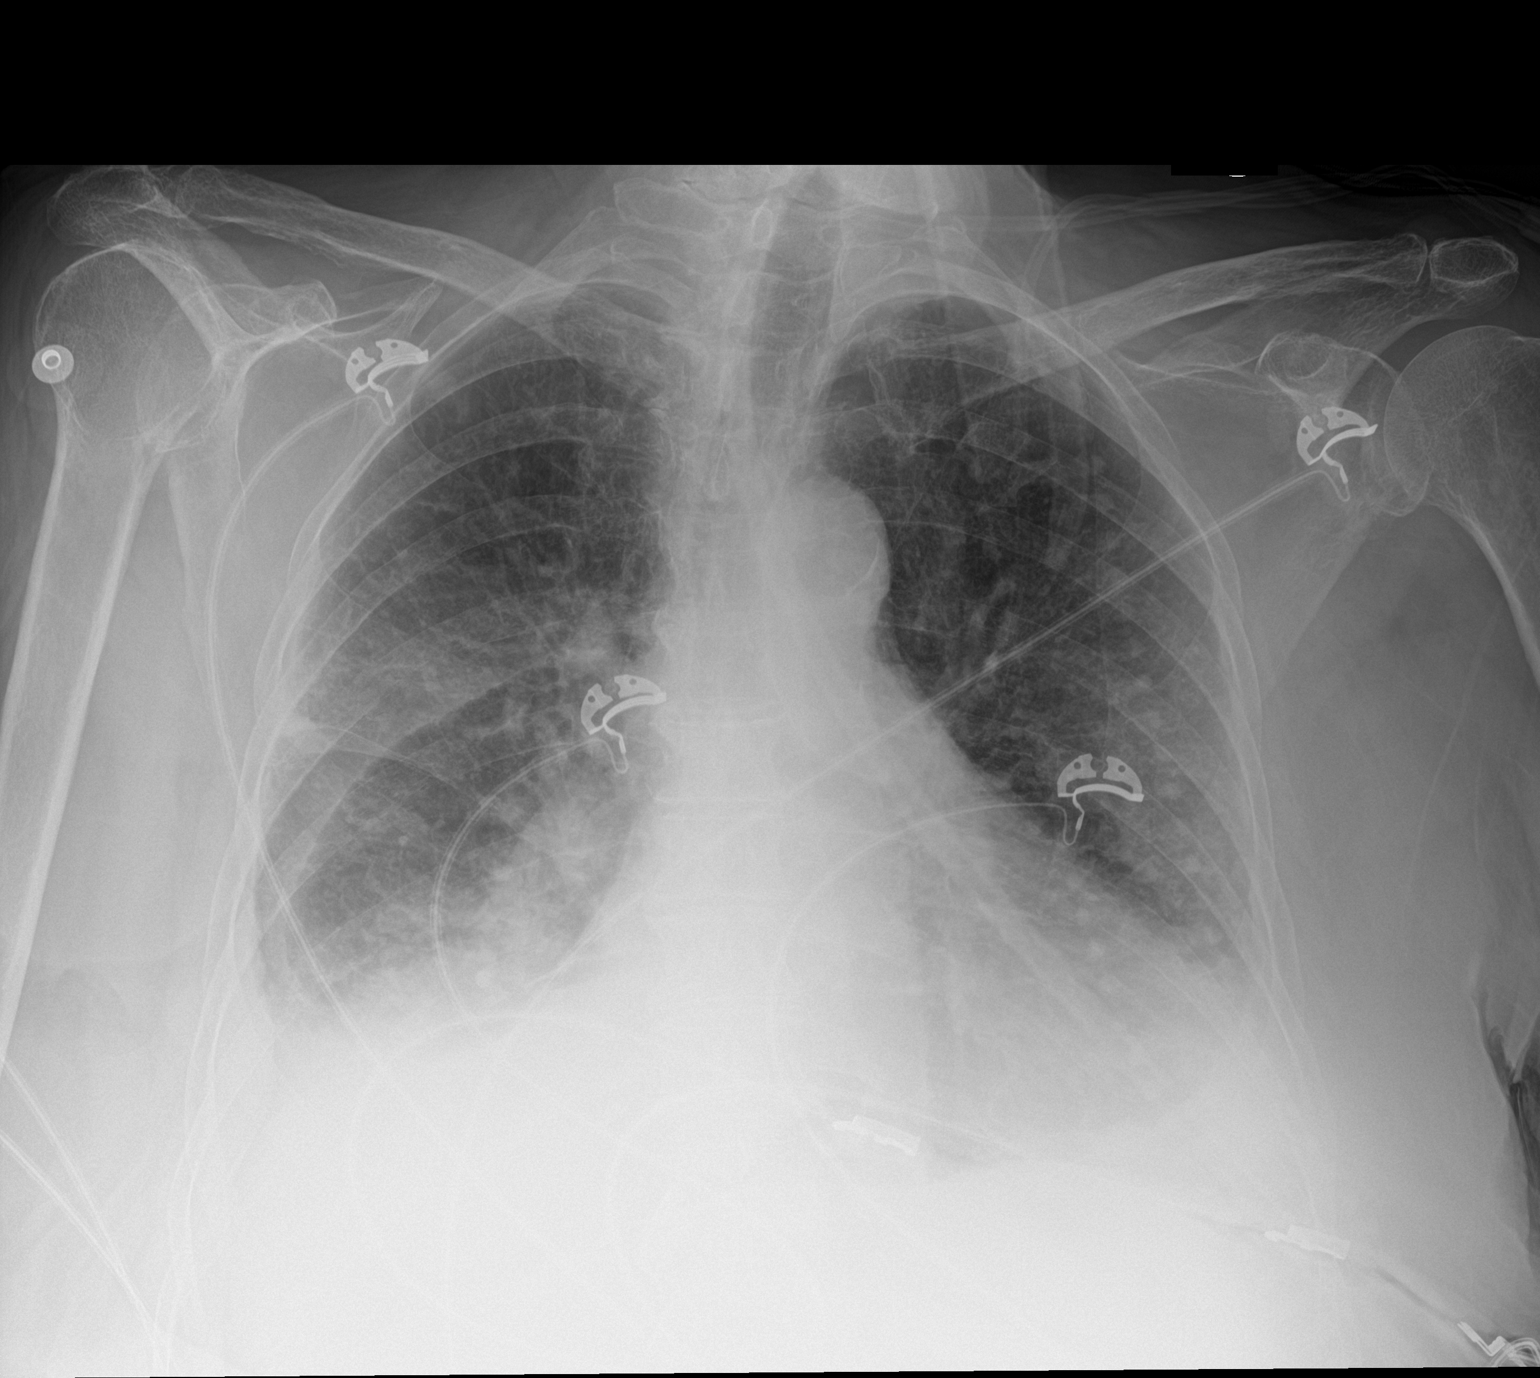

[1 of 1 positions shown; findings below may reference images not displayed]

FINDINGS: There is a moderate right effusion and a small left effusion.
Cardiomegaly. Pulmonary vascularity is within normal limits. No
discrete pulmonary edema.

Aortic atherosclerosis.

No acute bone abnormality.
IMPRESSION: Persistent bilateral effusions, right greater than left.
Cardiomegaly. No discrete pulmonary edema.

## 2019-03-02 IMAGING — DX DG CHEST 1V PORT
1 series · 1 of 1 positions shown · non-contrast
Comparison: 08/15/2017

CLINICAL DATA: Respiratory failure

EXAM:
PORTABLE CHEST 1 VIEW

[chest ap]
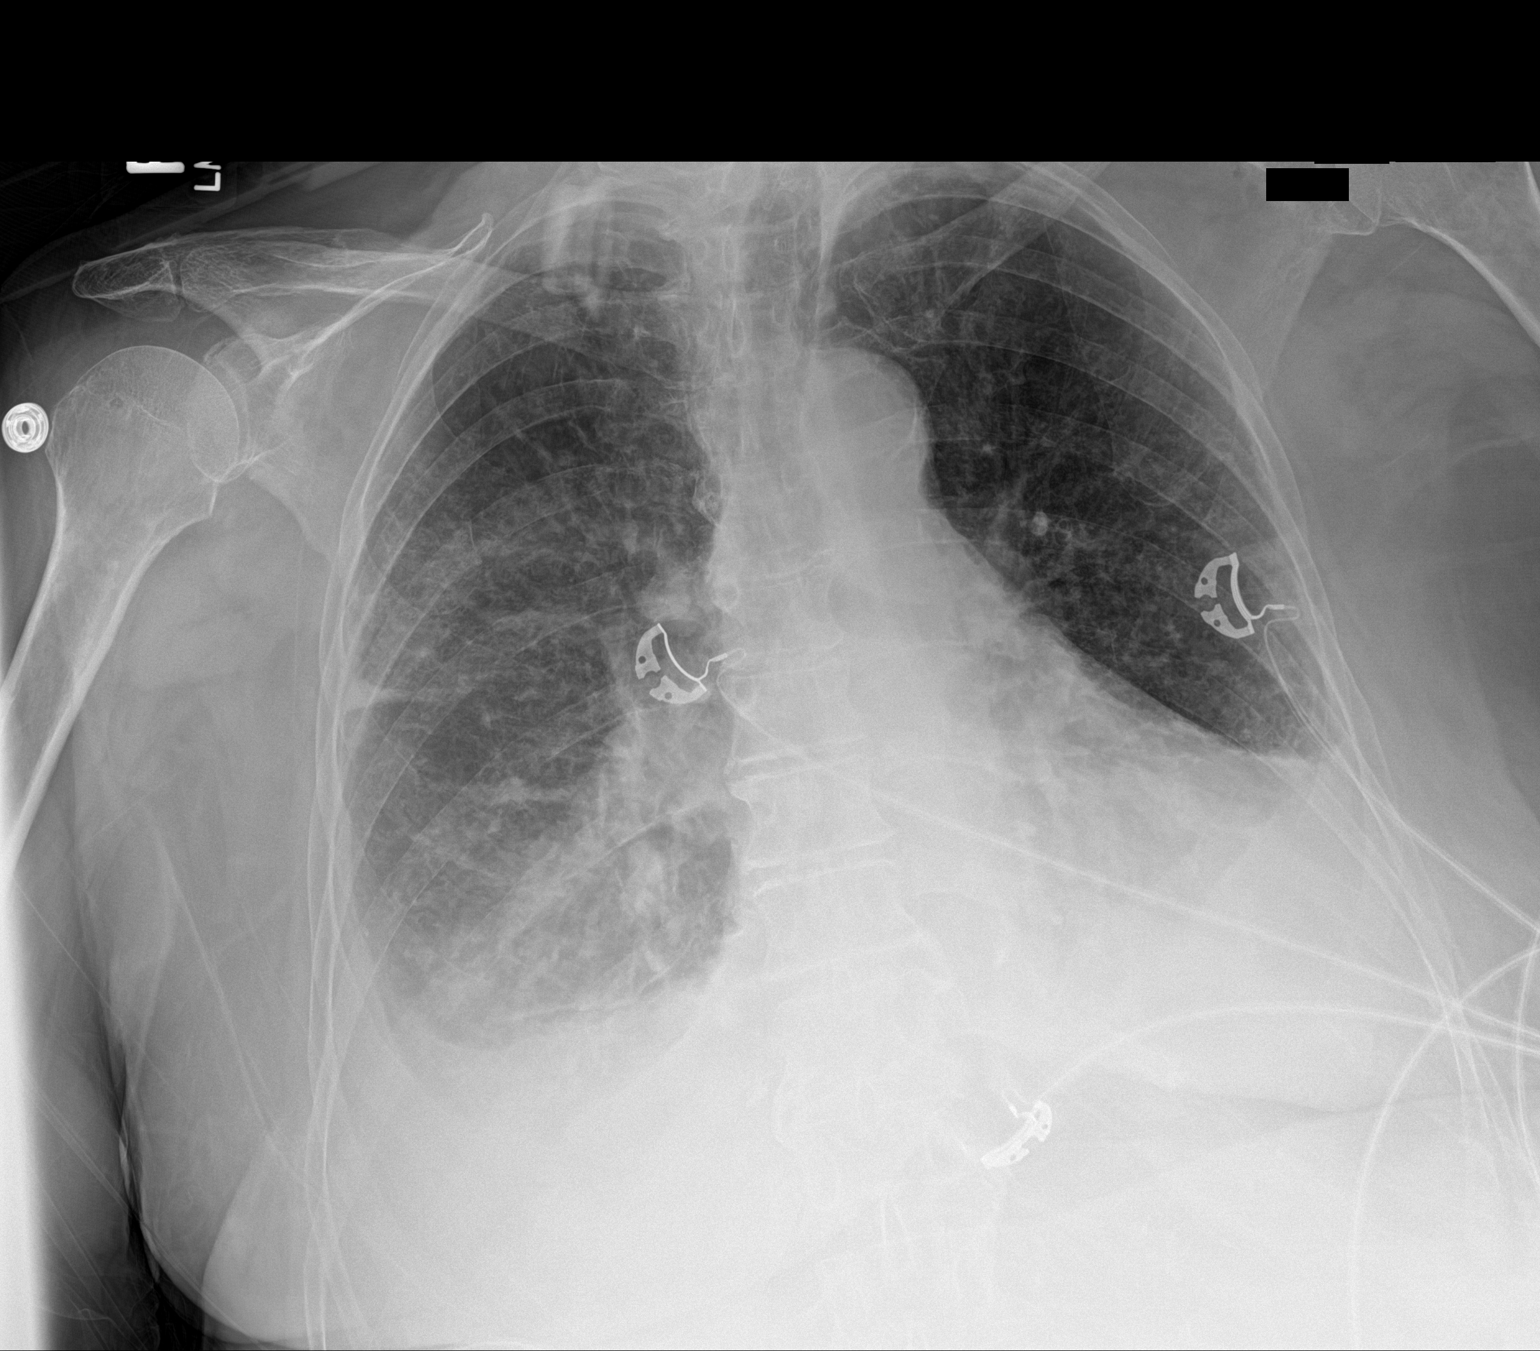

[1 of 1 positions shown; findings below may reference images not displayed]

FINDINGS: Cardiac shadow is stable. Aortic calcifications are again seen.
Bilateral pleural effusions are again noted and stable. Likely
underlying infiltrate/atelectasis is present. No acute bony
abnormality is seen.
IMPRESSION: Stable appearance of the chest with bilateral pleural effusions

## 2019-04-13 IMAGING — CR DG CHEST 2V
2 series · 2 of 2 positions shown · non-contrast
Comparison: 08/16/2017

CLINICAL DATA: Breathing difficulty

EXAM:
CHEST  2 VIEW

[chest lat]
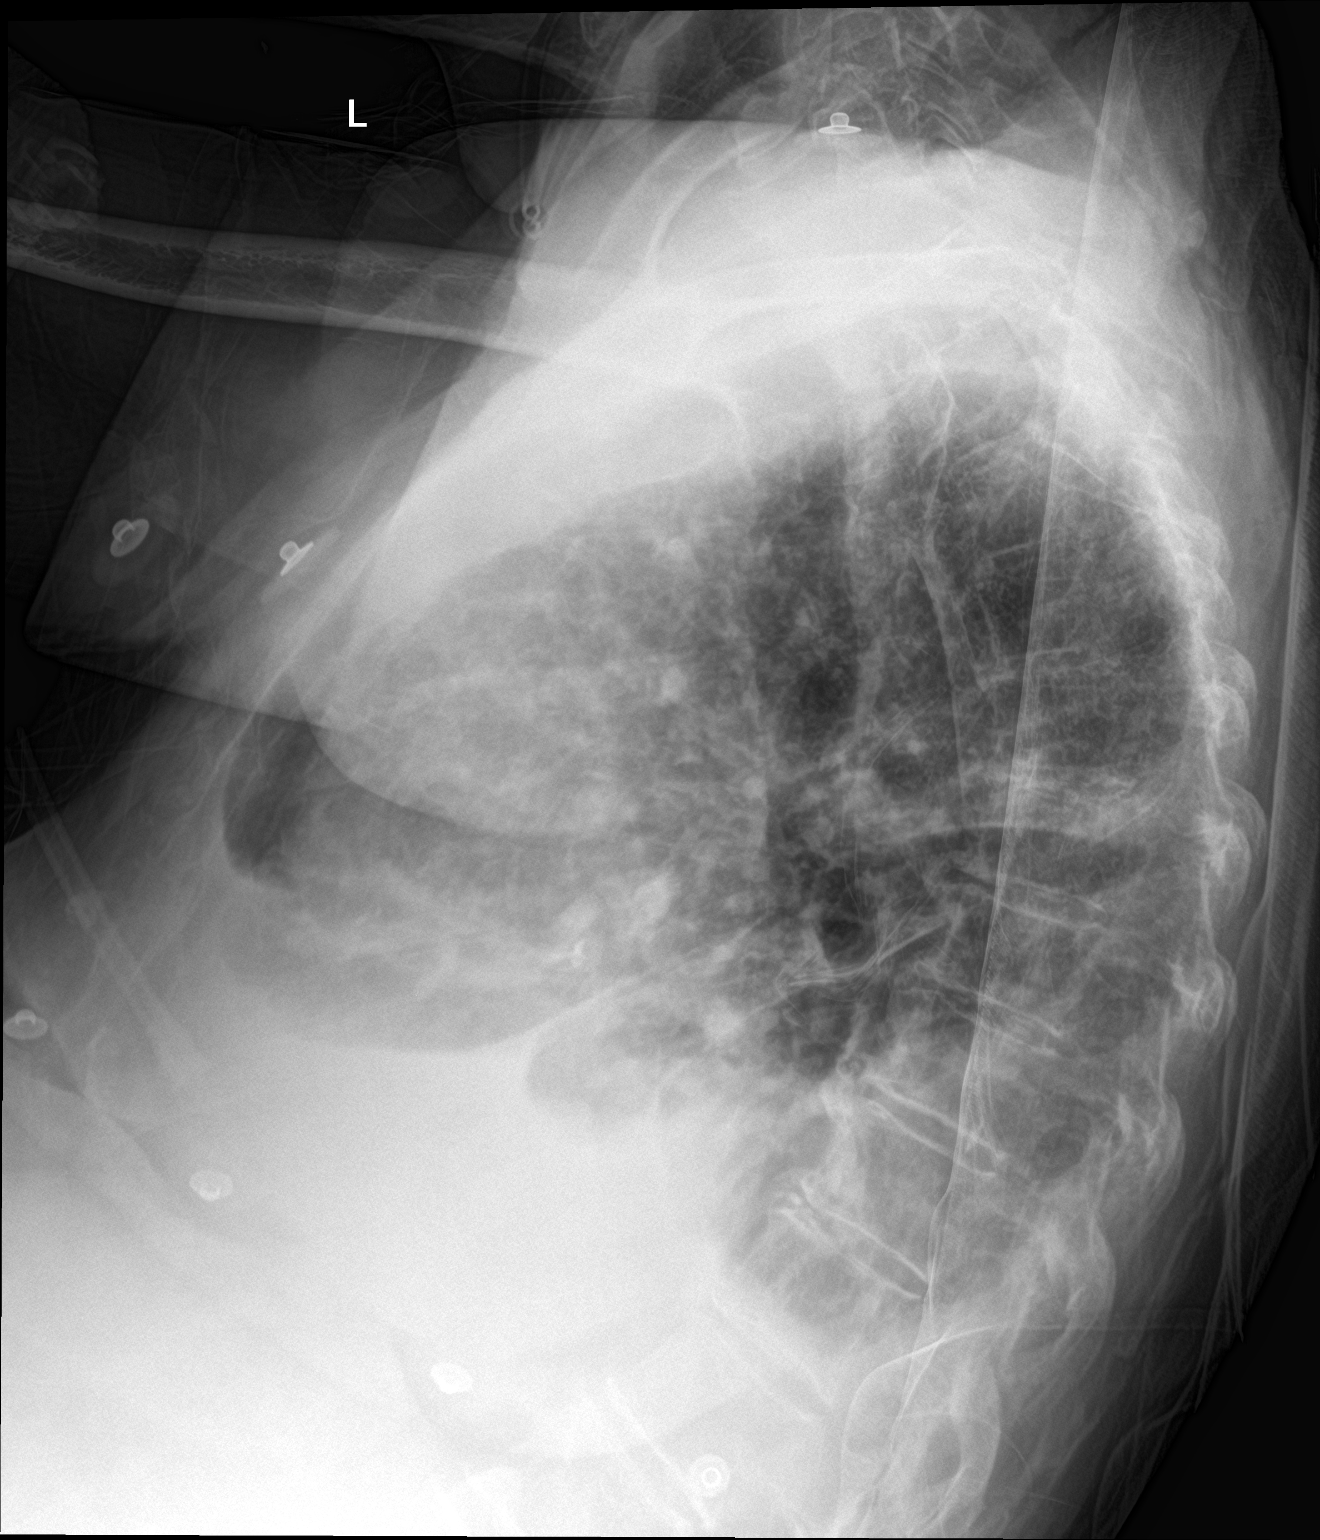

[chest ap]
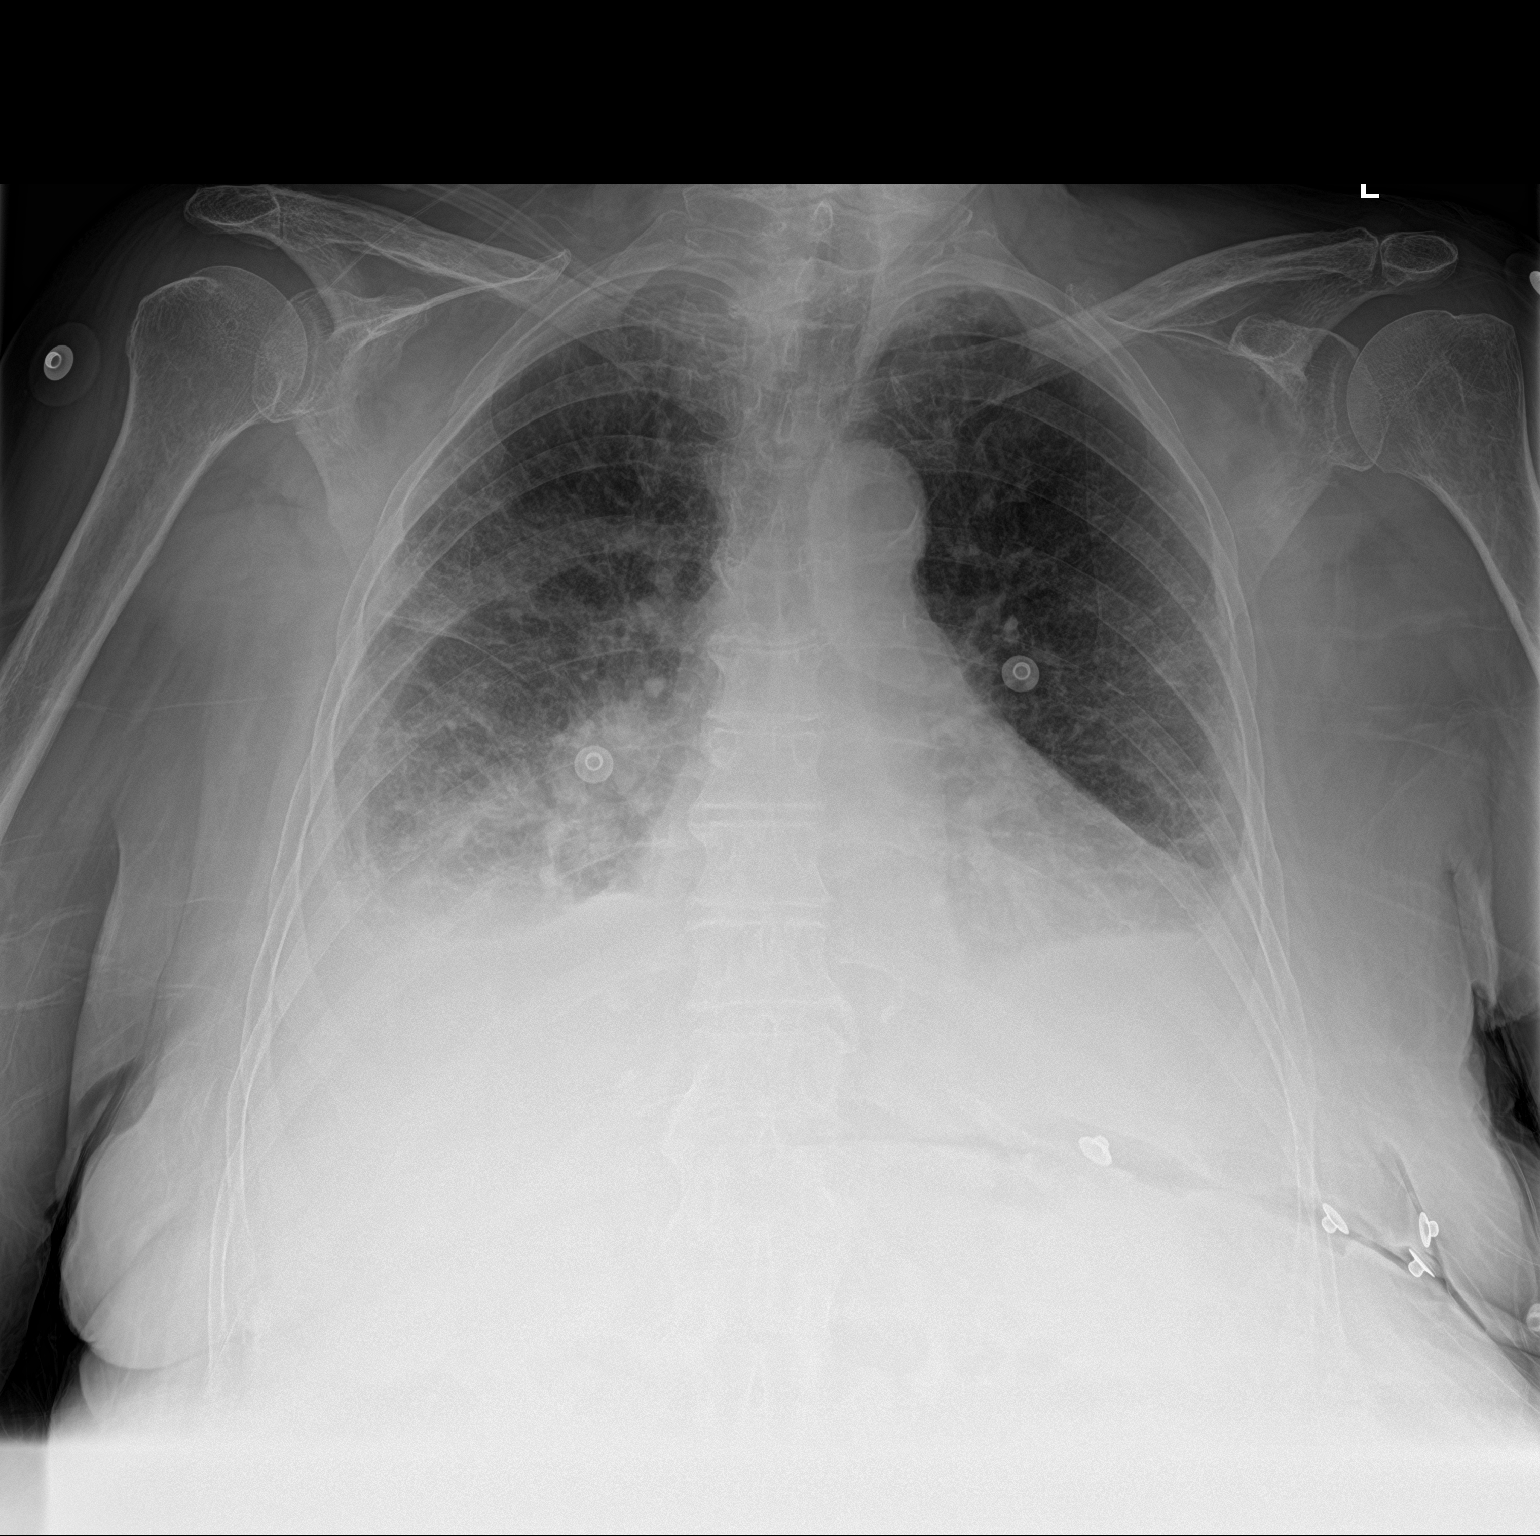

[2 of 2 positions shown; findings below may reference images not displayed]

FINDINGS: Small bilateral pleural effusions. Cardiomegaly with vascular
congestion and diffuse interstitial edema. Possible superimposed
pneumonia at the bases. Aortic atherosclerosis. No pneumothorax.
IMPRESSION: 1. Similar appearance of small bilateral effusions and bibasilar
atelectasis or pneumonia
2. Continued cardiomegaly with vascular congestion and diffuse
interstitial prominence compatible with mild edema.

## 2019-04-13 IMAGING — CR DG PELVIS 1-2V
1 series · 1 of 1 positions shown · non-contrast
Comparison: None.

CLINICAL DATA: Fell yesterday with sacrococcygeal pain

EXAM:
PELVIS - 1-2 VIEW

[pelvis ap]
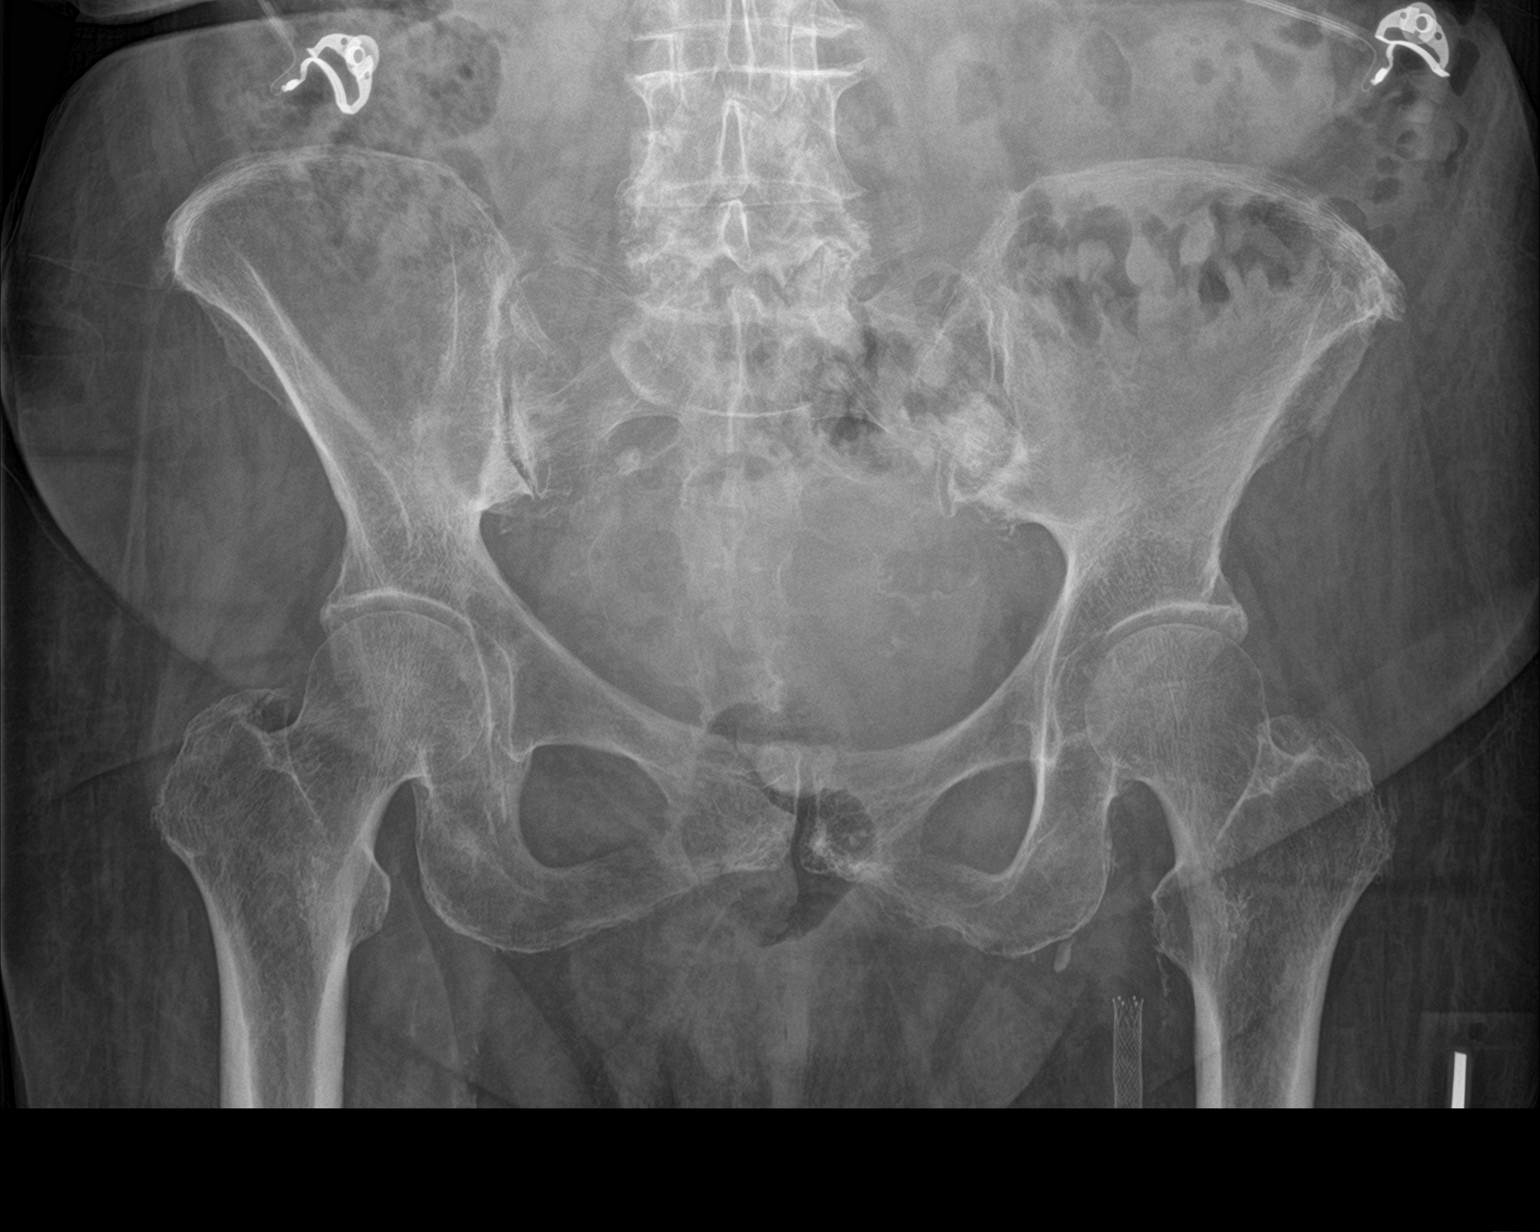

[1 of 1 positions shown; findings below may reference images not displayed]

FINDINGS: The bones are diffusely osteopenic. Both hip joint spaces appear
well preserved for age. The pelvic rami are intact. The SI joints
show degenerative change but no acute sacral fracture is evident by
plain film. There are degenerative changes in the lower lumbar
spine.
IMPRESSION: 1. No acute fracture.
2. Osteopenia.
3. Degenerative change in the lower lumbar spine and SI joints.
